# Patient Record
Sex: Male | Born: 1935 | Race: White | Hispanic: No | Smoking: Never smoker
Health system: Southern US, Community
[De-identification: ages and names within clinical notes are randomized; demographics above are authoritative.]

## PROBLEM LIST (undated history)

## (undated) DIAGNOSIS — I2510 Atherosclerotic heart disease of native coronary artery without angina pectoris: Secondary | ICD-10-CM

## (undated) DIAGNOSIS — I10 Essential (primary) hypertension: Secondary | ICD-10-CM

## (undated) DIAGNOSIS — K219 Gastro-esophageal reflux disease without esophagitis: Secondary | ICD-10-CM

## (undated) HISTORY — PX: TONSILLECTOMY: SUR1361

---

## 1997-12-13 ENCOUNTER — Ambulatory Visit (HOSPITAL_COMMUNITY): Admission: RE | Admit: 1997-12-13 | Discharge: 1997-12-13 | Payer: Self-pay | Admitting: *Deleted

## 1998-06-21 ENCOUNTER — Ambulatory Visit (HOSPITAL_COMMUNITY): Admission: RE | Admit: 1998-06-21 | Discharge: 1998-06-21 | Payer: Self-pay | Admitting: *Deleted

## 2003-11-21 ENCOUNTER — Inpatient Hospital Stay (HOSPITAL_COMMUNITY): Admission: EM | Admit: 2003-11-21 | Discharge: 2003-11-24 | Payer: Self-pay | Admitting: Emergency Medicine

## 2003-11-25 ENCOUNTER — Encounter: Admission: RE | Admit: 2003-11-25 | Discharge: 2003-11-25 | Payer: Self-pay | Admitting: Family Medicine

## 2004-04-23 ENCOUNTER — Emergency Department (HOSPITAL_COMMUNITY): Admission: EM | Admit: 2004-04-23 | Discharge: 2004-04-23 | Payer: Self-pay | Admitting: Emergency Medicine

## 2012-09-02 ENCOUNTER — Emergency Department (HOSPITAL_COMMUNITY)
Admission: EM | Admit: 2012-09-02 | Discharge: 2012-09-03 | Disposition: A | Discharge status: Home / Self Care | Payer: Medicare Other | Attending: Emergency Medicine | Admitting: Emergency Medicine

## 2012-09-02 ENCOUNTER — Emergency Department (HOSPITAL_COMMUNITY): Payer: Medicare Other

## 2012-09-02 ENCOUNTER — Encounter (HOSPITAL_COMMUNITY): Payer: Self-pay | Admitting: Emergency Medicine

## 2012-09-02 DIAGNOSIS — I2510 Atherosclerotic heart disease of native coronary artery without angina pectoris: Secondary | ICD-10-CM | POA: Insufficient documentation

## 2012-09-02 DIAGNOSIS — M4802 Spinal stenosis, cervical region: Secondary | ICD-10-CM | POA: Insufficient documentation

## 2012-09-02 DIAGNOSIS — R209 Unspecified disturbances of skin sensation: Secondary | ICD-10-CM | POA: Insufficient documentation

## 2012-09-02 DIAGNOSIS — IMO0002 Reserved for concepts with insufficient information to code with codable children: Secondary | ICD-10-CM

## 2012-09-02 DIAGNOSIS — Y929 Unspecified place or not applicable: Secondary | ICD-10-CM | POA: Insufficient documentation

## 2012-09-02 DIAGNOSIS — I10 Essential (primary) hypertension: Secondary | ICD-10-CM | POA: Insufficient documentation

## 2012-09-02 DIAGNOSIS — Y9389 Activity, other specified: Secondary | ICD-10-CM | POA: Insufficient documentation

## 2012-09-02 DIAGNOSIS — S0003XA Contusion of scalp, initial encounter: Secondary | ICD-10-CM | POA: Insufficient documentation

## 2012-09-02 DIAGNOSIS — S0083XA Contusion of other part of head, initial encounter: Secondary | ICD-10-CM | POA: Insufficient documentation

## 2012-09-02 DIAGNOSIS — W010XXA Fall on same level from slipping, tripping and stumbling without subsequent striking against object, initial encounter: Secondary | ICD-10-CM | POA: Insufficient documentation

## 2012-09-02 DIAGNOSIS — Z8719 Personal history of other diseases of the digestive system: Secondary | ICD-10-CM | POA: Insufficient documentation

## 2012-09-02 DIAGNOSIS — S01501A Unspecified open wound of lip, initial encounter: Secondary | ICD-10-CM | POA: Insufficient documentation

## 2012-09-02 HISTORY — DX: Essential (primary) hypertension: I10

## 2012-09-02 HISTORY — DX: Atherosclerotic heart disease of native coronary artery without angina pectoris: I25.10

## 2012-09-02 HISTORY — DX: Gastro-esophageal reflux disease without esophagitis: K21.9

## 2012-09-02 NOTE — ED Provider Notes (Addendum)
History     CSN: [id number]  Arrival date & time 09/02/12  1813   First MD Initiated Contact with Patient 09/02/12 1910      Chief Complaint  Patient presents with  . Fall  . Facial Laceration    (Consider location/radiation/quality/duration/timing/severity/associated sxs/prior treatment) HPI Comments: Patient comes to the ER for evaluation after a fall. Patient reports that he slipped on wet pavement and fell to the ground, landing face first. He is complaining of a laceration of the lip and pain and swelling around his nose. There was no loss of consciousness. Patient is not experiencing neck pain, but he does report numbness in tingling in both of his arms. He describes this as a pins and needles.  Patient is a 79 y.o. male presenting with fall.  Fall Pertinent negatives include no headaches.    Past Medical History  Diagnosis Date  . GERD (gastroesophageal reflux disease)   . Hypertension   . CAD (coronary artery disease)     History reviewed. No pertinent past surgical history.  No family history on file.  History  Substance Use Topics  . Smoking status: Never Smoker   . Smokeless tobacco: Not on file  . Alcohol Use: No      Review of Systems  HENT: Negative for neck pain.   Respiratory: Negative for shortness of breath.   Cardiovascular: Negative for chest pain.  Musculoskeletal: Positive for myalgias and arthralgias. Negative for back pain.  Skin: Positive for wound.  Neurological: Negative for headaches.  All other systems reviewed and are negative.    Allergies  Codeine  Home Medications  No current outpatient prescriptions on file.  BP 170/154  Pulse 60  Temp 98.7 F (37.1 C) (Oral)  Resp 19  SpO2 100%  Physical Exam  Constitutional: He is oriented to person, place, and time. He appears well-developed and well-nourished. No distress.  HENT:  Head: Normocephalic.  Right Ear: Hearing normal.  Nose: Nose normal.  Mouth/Throat: Oropharynx  is clear and moist and mucous membranes are normal.    Eyes: Conjunctivae normal and EOM are normal. Pupils are equal, round, and reactive to light.  Neck: Normal range of motion. Neck supple.  Cardiovascular: Normal rate, regular rhythm, S1 normal and S2 normal.  Exam reveals no gallop and no friction rub.   No murmur heard. Pulmonary/Chest: Effort normal and breath sounds normal. No respiratory distress. He exhibits no tenderness.  Abdominal: Soft. Normal appearance and bowel sounds are normal. There is no hepatosplenomegaly. There is no tenderness. There is no rebound, no guarding, no tenderness at McBurney's point and negative Murphy's sign. No hernia.  Musculoskeletal: Normal range of motion.  Neurological: He is alert and oriented to person, place, and time. He has normal strength. No cranial nerve deficit or sensory deficit. Coordination normal. GCS eye subscore is 4. GCS verbal subscore is 5. GCS motor subscore is 6.  Skin: Skin is warm, dry and intact. No rash noted. No cyanosis.  Psychiatric: He has a normal mood and affect. His speech is normal and behavior is normal. Thought content normal.    ED Course  Procedures (including critical care time)  Labs Reviewed - No data to display Dg Elbow Complete Left  09/02/2012  *RADIOLOGY REPORT*  Clinical Data: Fall  LEFT ELBOW - COMPLETE 3+ VIEW  Comparison: None.  Findings: There is spurring at the medial and lateral epicondyles of the humerus.  No acute fracture.  No dislocation.  No evidence of joint hemarthrosis.  IMPRESSION: No acute bony pathology.  Chronic changes.   Original Report Authenticated By: Jolaine Click, M.D.    Dg Elbow Complete Right  09/02/2012  *RADIOLOGY REPORT*  Clinical Data: Fall  RIGHT ELBOW - COMPLETE 3+ VIEW  Comparison: None.  Findings: No acute fracture and no dislocation.  IMPRESSION: No acute bony pathology.   Original Report Authenticated By: Jolaine Click, M.D.    Dg Wrist Complete Left  09/02/2012  *RADIOLOGY  REPORT*  Clinical Data: Fall  LEFT WRIST - COMPLETE 3+ VIEW  Comparison: None.  Findings: No acute fracture.  No dislocation.  Chondrocalcinosis of the triangular fibrocartilage is noted.  Degenerative changes at the first metacarpal phalangeal joint are present.  IMPRESSION: Chronic changes.  No acute bony pathology.   Original Report Authenticated By: Jolaine Click, M.D.    Dg Wrist Complete Right  09/02/2012  *RADIOLOGY REPORT*  Clinical Data: Injury  RIGHT WRIST - COMPLETE 3+ VIEW  Comparison: None.  Findings: No acute fracture.  No dislocation.  Chronic appearing erosion of the distal ulna.  Chondrocalcinosis of the triangular fibrocartilage.  Degenerative changes in the thumb.  IMPRESSION: No acute bony pathology.  Chronic changes.  Consider gout versus CPPD.   Original Report Authenticated By: Jolaine Click, M.D.    Ct Head Wo Contrast  09/02/2012  *RADIOLOGY REPORT*  Clinical Data: Fall.  CT HEAD WITHOUT CONTRAST  Technique:  Contiguous axial images were obtained from the base of the skull through the vertex without contrast.  Comparison: 11/21/2003.  Findings: No skull fracture or intracranial hemorrhage.  Global atrophy without hydrocephalus.  Small vessel disease type changes.  Remote right caudate head infarct.  No CT evidence of large acute infarct.  No intracranial mass lesion detected on this unenhanced exam.  Vascular calcifications.  Partial empty sella.  Globes appear intact.  IMPRESSION: No skull fracture or intracranial hemorrhage.  Please see above.   Original Report Authenticated By: Lacy Duverney, M.D.    Ct Cervical Spine Wo Contrast  09/02/2012  *RADIOLOGY REPORT*  Clinical Data: Fall.  Tingling and numbness both arms.  CT CERVICAL SPINE WITHOUT CONTRAST  Technique:  Multidetector CT imaging of the cervical spine was performed. Multiplanar CT image reconstructions were also generated.  Comparison: MR same date.  Findings: No evidence of cervical spine fracture.  The questioned minimal  amount of fluid anterior to the mid cervical spine may represent fat as no prevertebral fluid is noted on present exam.  Degenerative changes C1-2 articulation with small cystic changes of the dens.  Cervical spondylotic changes contribute to marked bilateral foraminal narrowing C3-4 through C6-7.  Disc osteophyte complex C3-4 through C6-7 with spinal stenosis and cord flattening most prominent C4-5 and C5-6 level (slightly less notable at the C3-4 level followed by the C6-7 level).  Visualized lung apices clear.  IMPRESSION: No evidence of cervical spine fracture.  The questioned minimal amount of fluid anterior to the mid cervical spine may represent fat as no prevertebral fluid is noted on present exam.  Degenerative changes C1-2 articulation with small cystic changes of the dens.  Cervical spondylotic changes contribute to marked bilateral foraminal narrowing C3-4 through C6-7.  Disc osteophyte complex C3-4 through C6-7 with spinal stenosis and cord flattening most prominent C4-5 and C5-6 level (slightly less notable at the C3-4 level followed by the C6-7 level).   Original Report Authenticated By: Lacy Duverney, M.D.    Mr Cervical Spine Wo Contrast  09/02/2012  *RADIOLOGY REPORT*  Clinical Data: Fall.  Tingling and numbness  in both arms.  MRI CERVICAL SPINE WITHOUT CONTRAST  Technique:  Multiplanar and multiecho pulse sequences of the cervical spine, to include the craniocervical junction and cervicothoracic junction, were obtained according to standard protocol without intravenous contrast.  Comparison: No comparison cervical spine exam.  Findings: Cervical medullary junction and visualized intracranial structures unremarkable.  Minimal prevertebral fluid C3, C4 and C5 level without other findings to suggest ligamentous injury or osseous injury.  If the osseous injury is of high clinical concern then correlation with CT recommended as MR may not detect subtle fractures.  Degenerative changes C1-2  articulation with subchondral cyst of the dens.  No focal cervical cord signal abnormality.  Both vertebral arteries are patent.  Visualized paravertebral structures unremarkable.  C2-3:  Left-sided facet joint degenerative changes.  Very mild bulge.  C3-4:  Facet joint degenerative changes greater on the left.  Broad- based disc osteophyte complex with minimal retrolisthesis of C3. Spinal stenosis with mild cord flattening greater on the right. Marked bilateral foraminal narrowing.  C4-5:  Broad-based disc osteophyte complex slightly greater to the right.  Spinal stenosis with cord compression greater on the right. Marked bilateral foraminal narrowing greater on the right.  C5-6: Broad-based disc osteophyte complex.  Spinal stenosis with cord compression.  Marked bilateral foraminal narrowing.  C6-7:  Broad-based disc osteophyte complex.  Spinal stenosis with mild cord flattening.  Marked bilateral foraminal narrowing.  C7-T1:  Facet joint degenerative changes.  Minimal anterior slip C7.  Right-sided prominent nerve root sleeve.  Very mild bulge. Mild left foraminal narrowing.  IMPRESSION: Minimal prevertebral fluid C3, C4 and C5 level without other findings to suggest ligamentous injury or osseous injury.  If the osseous injury is of high clinical concern then correlation with CT recommended as MR may not detect subtle fractures.  Cervical spondylotic changes most notable C3-4 through the C6-7 level where bilateral prominent foraminal narrowing is noted. Spinal stenosis and cord compression is most significant at the C4- 5 and C5-6 level.  Please see above for further detail.  Results discussed with Dr. Blinda Leatherwood 09/02/2012 10:35 p.m.   Original Report Authenticated By: Lacy Duverney, M.D.      Diagnosis: 1. Cervical radiculopathy 2. Spinal stenosis 3. Facial contusion 4. Lip laceration    MDM  Patient presents to the ER after a fall. Patient slipped on wet pavement and fell face forward. He came to the ER  with complaints of facial contusion laceration, but also complains of bilateral paresthesias, pins and needles in the arms. There was concern for possible central cord syndrome. He did not have any perceived and objective weakness, but does have the subjective paresthesias. CT head, CT cervical spine, MRI cervical spine were performed. Patient has findings of fairly significant spinal stenosis no obvious cord lesion.  Case was discussed with Dr. Phoebe Perch, on call for neurosurgery. He has reviewed the films and does not feel there is any need for surgical intervention. He reports that the patient can be treated with a Medrol Dosepak and vitamin E, soft collar and followup in the office in 2-3 weeks.  Patient was counseled to return to the ER immediately if he has any increased paresthesias or weakness in the upper extremities.      Gilda Crease, MD 09/03/12 0004  Patient's laceration to lower lip is very superficial. He did not require any repair.  Gilda Crease, MD 09/03/12 [phone]

## 2012-09-02 NOTE — ED Notes (Signed)
Patient transported to CT 

## 2012-09-02 NOTE — ED Notes (Signed)
Pt states he [place] mall and both arms are in pain 10/10,   And facial abrasions on lips and bruising under right eye.  Denies LOC,  Pt is confused and ask the same questions over and over,  Daughter states this is his norm and that he resides along due to the passing of his wife this past August

## 2012-09-02 NOTE — ED Notes (Signed)
States that he slipped on wet pavement. States that he has pin and needle sensation in arms.

## 2012-09-02 NOTE — ED Notes (Signed)
Reassessed pt after returning from MRI,  Cleaned pt face,   Pt has complaints of bilateral arm pain that is unchanged

## 2012-09-03 MED ORDER — HYDROCODONE-ACETAMINOPHEN 5-325 MG PO TABS: 1.0000 | ORAL_TABLET | ORAL | Status: DC | PRN

## 2012-09-03 MED ORDER — HYDROCORTISONE 1 % EX CREA: TOPICAL_CREAM | CUTANEOUS | Status: DC

## 2012-09-03 MED ORDER — MORPHINE SULFATE 4 MG/ML IJ SOLN
4.0000 mg | Freq: Once | INTRAMUSCULAR | Status: AC
  Administered 2012-09-03: 4 mg via INTRAMUSCULAR
  Filled 2012-09-03: qty 1

## 2012-09-03 MED ORDER — VITAMIN E 200 UNITS PO CAPS: 400.0000 [IU] | ORAL_CAPSULE | Freq: Two times a day (BID) | ORAL | Status: DC

## 2012-09-03 MED ORDER — METHYLPREDNISOLONE 4 MG PO KIT: PACK | ORAL | Status: DC

## 2012-09-03 MED ORDER — DEXAMETHASONE SODIUM PHOSPHATE 10 MG/ML IJ SOLN
10.0000 mg | Freq: Once | INTRAMUSCULAR | Status: AC
  Administered 2012-09-03: 10 mg via INTRAMUSCULAR
  Filled 2012-09-03: qty 1

## 2012-09-03 MED ORDER — ONDANSETRON HCL 4 MG/2ML IJ SOLN
4.0000 mg | Freq: Once | INTRAMUSCULAR | Status: AC
  Administered 2012-09-03: 4 mg via INTRAMUSCULAR
  Filled 2012-09-03: qty 2

## 2012-12-28 ENCOUNTER — Emergency Department (HOSPITAL_COMMUNITY)
Admission: EM | Admit: 2012-12-28 | Discharge: 2012-12-28 | Disposition: A | Discharge status: Home / Self Care | Payer: Medicare Other | Attending: Emergency Medicine | Admitting: Emergency Medicine

## 2012-12-28 ENCOUNTER — Encounter (HOSPITAL_COMMUNITY): Payer: Self-pay | Admitting: *Deleted

## 2012-12-28 DIAGNOSIS — R0981 Nasal congestion: Secondary | ICD-10-CM

## 2012-12-28 DIAGNOSIS — I10 Essential (primary) hypertension: Secondary | ICD-10-CM | POA: Insufficient documentation

## 2012-12-28 DIAGNOSIS — J3489 Other specified disorders of nose and nasal sinuses: Secondary | ICD-10-CM | POA: Insufficient documentation

## 2012-12-28 DIAGNOSIS — Z8719 Personal history of other diseases of the digestive system: Secondary | ICD-10-CM | POA: Insufficient documentation

## 2012-12-28 DIAGNOSIS — I2510 Atherosclerotic heart disease of native coronary artery without angina pectoris: Secondary | ICD-10-CM | POA: Insufficient documentation

## 2012-12-28 MED ORDER — SALINE SPRAY 0.65 % NA SOLN
1.0000 | NASAL | Status: DC | PRN
  Administered 2012-12-28: 1 via NASAL
  Filled 2012-12-28: qty 44

## 2012-12-28 NOTE — ED Notes (Signed)
The pt has had nasal congestion tonight after he used some nose drops that he received through the mail.  He has not been  Able to sleep because of the nasal congestion.  No other problems  Except for the nasal congestion

## 2012-12-28 NOTE — ED Provider Notes (Signed)
History     CSN: [id number]  Arrival date & time 12/28/12  0251   First MD Initiated Contact with Patient 12/28/12 0515      Chief Complaint  Patient presents with  . nasal congestion     (Consider location/radiation/quality/duration/timing/severity/associated sxs/prior treatment) HPI Comments: [name] is an 79 year old gentleman, with seasonal, allergies.  He's been taking loratadine for his symptoms.  Proximal to the [place].  He, states he was using some nasal spray from [employer] and not having any problems with nasal congestion, but he received in the mail to different nasal sprays from the [place].  He used one aunt since that time.  Last night.  He has had increased congestion, and difficulty breathing through his nose.  He denies any fever.  Nose bleed, headache, dizziness.  But is concerned because of the increased congestion Patient does not know the name of these medications and did not bring them with him  The history is provided by the patient.    Past Medical History  Diagnosis Date  . GERD (gastroesophageal reflux disease)   . Hypertension   . CAD (coronary artery disease)     History reviewed. No pertinent past surgical history.  No family history on file.  History  Substance Use Topics  . Smoking status: Never Smoker   . Smokeless tobacco: Not on file  . Alcohol Use: No      Review of Systems  Unable to perform ROS Constitutional: Negative for fever and chills.  HENT: Positive for congestion. Negative for sore throat, rhinorrhea, trouble swallowing and postnasal drip.   Respiratory: Negative for cough, shortness of breath and wheezing.   Gastrointestinal: Negative for nausea and vomiting.  Neurological: Negative for dizziness, weakness and headaches.  All other systems reviewed and are negative.    Allergies  Codeine  Home Medications  No current outpatient prescriptions on file.  BP 127/61  Pulse 67  Temp(Src) 97.9 F (36.6 C) (Oral)  Resp 18   SpO2 94%  Physical Exam  Nursing note and vitals reviewed. Constitutional: He is oriented to person, place, and time. He appears well-developed and well-nourished.  HENT:  Head: Normocephalic.  Nose: Sinus tenderness present. No mucosal edema or rhinorrhea. Right sinus exhibits no maxillary sinus tenderness and no frontal sinus tenderness. Left sinus exhibits no maxillary sinus tenderness and no frontal sinus tenderness.  Mouth/Throat: Oropharynx is clear and moist.  Mucosa appears dry in the anterior nares  Eyes: Conjunctivae are normal. Pupils are equal, round, and reactive to light.  Neck: Normal range of motion.  Cardiovascular: Normal rate and regular rhythm.   Pulmonary/Chest: Effort normal and breath sounds normal.  Abdominal: Soft.  Musculoskeletal: Normal range of motion.  Neurological: He is alert and oriented to person, place, and time.  Skin: Skin is warm and dry.    ED Course  Procedures (including critical care time)  Labs Reviewed - No data to display No results found.   1. Nasal congestion       MDM   I recommended that the patient.  Not use the prescription, nasal spray from the [place] until he speaks with his physician.  Unfortunately, the patient did not know the name of the sprays at the time of evaluation I provided the patient with a bottle of Ocean mist nasal spray that he can use to moisten his nasal mucosa        Arman Filter, NP 12/28/12 [phone]

## 2012-12-28 NOTE — ED Provider Notes (Signed)
Medical screening examination/treatment/procedure(s) were conducted as a shared visit with non-physician practitioner(s) and myself.  I personally evaluated the patient during the encounter. [facility] nasal congestion, has 2 prescriptions at home from PCP unable to say what they are. No fevers, or facial swelling, no indication for ABX. Plan saline nasal spray and close outpatient follow up  Sunnie Nielsen, MD 12/28/12 [phone]

## 2013-03-15 ENCOUNTER — Emergency Department (HOSPITAL_COMMUNITY): Payer: Medicare Other

## 2013-03-15 ENCOUNTER — Encounter (HOSPITAL_COMMUNITY): Payer: Self-pay | Admitting: *Deleted

## 2013-03-15 ENCOUNTER — Emergency Department (HOSPITAL_COMMUNITY)
Admission: EM | Admit: 2013-03-15 | Discharge: 2013-03-15 | Disposition: A | Discharge status: Home / Self Care | Payer: Medicare Other | Attending: Emergency Medicine | Admitting: Emergency Medicine

## 2013-03-15 DIAGNOSIS — Z8719 Personal history of other diseases of the digestive system: Secondary | ICD-10-CM | POA: Insufficient documentation

## 2013-03-15 DIAGNOSIS — I2510 Atherosclerotic heart disease of native coronary artery without angina pectoris: Secondary | ICD-10-CM | POA: Insufficient documentation

## 2013-03-15 DIAGNOSIS — R072 Precordial pain: Secondary | ICD-10-CM | POA: Insufficient documentation

## 2013-03-15 DIAGNOSIS — R079 Chest pain, unspecified: Secondary | ICD-10-CM

## 2013-03-15 DIAGNOSIS — I10 Essential (primary) hypertension: Secondary | ICD-10-CM | POA: Insufficient documentation

## 2013-03-15 LAB — URINALYSIS, ROUTINE W REFLEX MICROSCOPIC
Bilirubin Urine: NEGATIVE
Leukocytes, UA: NEGATIVE
Nitrite: NEGATIVE
Specific Gravity, Urine: 1.011 (ref 1.005–1.030)
Urobilinogen, UA: 1 mg/dL (ref 0.0–1.0)
pH: 7 (ref 5.0–8.0)

## 2013-03-15 LAB — COMPREHENSIVE METABOLIC PANEL
Albumin: 3.6 g/dL (ref 3.5–5.2)
Alkaline Phosphatase: 49 U/L (ref 39–117)
BUN: 12 mg/dL (ref 6–23)
Calcium: 9.2 mg/dL (ref 8.4–10.5)
GFR calc Af Amer: 72 mL/min — ABNORMAL LOW (ref >90)
Glucose, Bld: 112 mg/dL — ABNORMAL HIGH (ref 70–99)
Potassium: 3.9 mEq/L (ref 3.5–5.1)
Total Protein: 6 g/dL (ref 6.0–8.3)

## 2013-03-15 LAB — CBC WITH DIFFERENTIAL/PLATELET
Basophils Relative: 0 % (ref 0–1)
Eosinophils Absolute: 0.1 10*3/uL (ref 0.0–0.7)
Eosinophils Relative: 2 % (ref 0–5)
Hemoglobin: 11.6 g/dL — ABNORMAL LOW (ref 13.0–17.0)
MCH: 35.6 pg — ABNORMAL HIGH (ref 26.0–34.0)
MCHC: 37.4 g/dL — ABNORMAL HIGH (ref 30.0–36.0)
MCV: 95.1 fL (ref 78.0–100.0)
Monocytes Relative: 10 % (ref 3–12)
Neutrophils Relative %: 68 % (ref 43–77)
Platelets: 225 10*3/uL (ref 150–400)

## 2013-03-15 LAB — POCT I-STAT TROPONIN I: Troponin i, poc: 0.01 ng/mL (ref 0.00–0.08)

## 2013-03-15 LAB — URINE MICROSCOPIC-ADD ON

## 2013-03-15 LAB — LIPASE, BLOOD: Lipase: 87 U/L — ABNORMAL HIGH (ref 11–59)

## 2013-03-15 MED ORDER — ASPIRIN 81 MG PO CHEW: 324.0000 mg | CHEWABLE_TABLET | Freq: Once | ORAL | Status: DC

## 2013-03-15 MED ORDER — NITROGLYCERIN 0.4 MG SL SUBL
0.4000 mg | SUBLINGUAL_TABLET | SUBLINGUAL | Status: AC | PRN
  Administered 2013-03-15 (×3): 0.4 mg via SUBLINGUAL
  Filled 2013-03-15: qty 25

## 2013-03-15 NOTE — ED Notes (Signed)
Patient given sandwich and ginger ale 

## 2013-03-15 NOTE — ED Notes (Signed)
Pt reported Lt sided CP that radiated into LT arm. Initial CP was 8/10. Ems gave 3 baby ASA and a totoal of 3 NGT SL tabs . The CP was then 4/10. Pt denies a HX of MI .

## 2013-03-15 NOTE — ED Provider Notes (Addendum)
Pt signed out by Dr Elesa Massed, that she had discussed admit/transfer to [place], and that pt was refusing admit, ?repeat trop pending.   Recheck pt, denies any cp or discomfort.  Pt indicates he was not concerned with any cp, but states he has long hx gerd for the past 30+ years, states he felt like that was acting up earlier.  Pt also indicates was having bil ankle pain for 'long time' and was told possibly arthritis by his doctor and orthopedist.  Pt denies leg swelling.   Pt on several rechecks remains asymptomatic. No cp or sob.  I discussed w pt that Dr Elesa Massed had mentioned possible admit/transfer to [place], and I also offered admission here.  Pt states he does not want to be transferred to [facility] or admitted here.  Pt requests d/c to home, states he wants no further evaluation or testing, feels fine, and is ready for d/c.   2nd troponin approx 7 hours after arrival to ed also normal.   Pt remains asymptomatic, and persists in request for d/c, not wanting admission or [facility] transfer.  Per pts requests, will d/c to home, encourage close follow up with pcp and cardiology.        Suzi Roots, MD 03/15/13 2001

## 2013-03-15 NOTE — ED Notes (Signed)
Call PT Daughter at [phone] [facility].

## 2013-03-15 NOTE — ED Notes (Signed)
PT refuses Chest X-ray because [facility].

## 2013-03-15 NOTE — ED Notes (Signed)
Call [facility] to see how to get a pt.admitted they stated that they will fax a package and we will need to fill it out and also send all records and labs back to them with the admit package.

## 2013-03-15 NOTE — ED Provider Notes (Signed)
TIME SEEN: 12:52 PM  CHIEF COMPLAINT: Chest pain  HPI: Patient is an 79 year old male with a history of hypertension, acid reflux, ? CAD who presents the emergency department with substernal chest pain that started while at church this morning. He is unable to describe the pain as he is a very poor historian. He denies to me that he has had any radiation of this pain. Denies any shortness of breath, nausea, diaphoresis. He is unable to tell me if he has had similar chest pain in the past. Denies a prior history of MI or cardiac stents. No history of PE or DVT. No recent fever, cough. No vomiting or diarrhea. No bloody stools or melena.  Per EMS, patient's chest pain did improve with nitroglycerin tablets.  Patient has received aspirin by EMS.  ROS: See HPI Constitutional: no fever  Eyes: no drainage  ENT: no runny nose   Cardiovascular:  chest pain  Resp: no SOB  GI: no vomiting GU: no dysuria Integumentary: no rash  Allergy: no hives  Musculoskeletal: no leg swelling  Neurological: no slurred speech ROS otherwise negative  PAST MEDICAL HISTORY/PAST SURGICAL HISTORY:  Past Medical History  Diagnosis Date  . GERD (gastroesophageal reflux disease)   . Hypertension   . CAD (coronary artery disease)     MEDICATIONS:  Prior to Admission medications   Not on File    ALLERGIES:  Allergies  Allergen Reactions  . Codeine Other (See Comments)    confusion    SOCIAL HISTORY:  History  Substance Use Topics  . Smoking status: Never Smoker   . Smokeless tobacco: Not on file  . Alcohol Use: No    FAMILY HISTORY: No family history on file.  EXAM: There were no vitals taken for this visit. CONSTITUTIONAL: Alert and oriented x3, a poor historian and responds appropriately to questions. Well-appearing; well-nourished HEAD: Normocephalic EYES: Conjunctivae clear, PERRL ENT: normal nose; no rhinorrhea; moist mucous membranes; pharynx without lesions noted NECK: Supple, no  meningismus, no LAD  CARD: RRR; S1 and S2 appreciated; no murmurs, no clicks, no rubs, no gallops RESP: Normal chest excursion without splinting or tachypnea; breath sounds clear and equal bilaterally; no wheezes, no rhonchi, no rales,  ABD/GI: Normal bowel sounds; non-distended; soft, non-tender, no rebound, no guarding BACK:  The back appears normal and is non-tender to palpation, there is no CVA tenderness EXT: Normal ROM in all joints; non-tender to palpation; no edema; normal capillary refill; no cyanosis    SKIN: Normal color for age and race; warm NEURO: Moves all extremities equally PSYCH: The patient's mood and manner are appropriate. Grooming and personal hygiene are appropriate.  MEDICAL DECISION MAKING: Patient with some risk factors for ACS with chest pain. Patient is unable to give much details for his chest pain.  Suspect early cognitive delay. His exam is unremarkable and he is hemodynamically stable. We'll obtain cardiac labs, chest x-ray and attempt to get chest pain-free.   Date: 03/15/2013 12:55PM  Rate: 60  Rhythm: normal sinus rhythm  QRS Axis: normal  Intervals: normal  ST/T Wave abnormalities: normal  Conduction Disutrbances: none  Narrative Interpretation: unremarkable; no ischemic changes     ED PROGRESS: The patient's EKG shows no ischemic changes. His cardiac enzymes x 1 are negative. He still has intermittent episodes of what he describes as severe chest pain that improve with nitroglycerin. Concern for stuttering chest pain that may be unstable angina. He is still hemodynamically stable. He is refusing any chest x-ray at this  time. I discussed with his daughter, Chip Boer, who is also his power of attorney who reports the patient has been complaining of bilateral lower extremity pain recently and has a possible new diagnosis of prostate cancer. Patient does have multiple risk factors for pulmonary embolus but is not hypoxic or tachycardic. He is refusing to let me do  any with further workup at this time and stated he would like to be transferred to [place] for further evaluation and treatment. Patient's daughter was unable to provide any other history she was not with the patient today and she is not very familiar with his medical issues.  She agrees with transfer to [facility]. Patient also refusing any other medications at this time.  Daughter Chip Boer cell [phone]                          Work [phone]  Remigio Eisenmenger cell [phone]  5:07 PM  Signed out.  Waiting to hear back from [place] for transfer for admission for chest pain.  Layla Maw Casson Catena, DO 03/15/13 [phone]

## 2014-05-31 ENCOUNTER — Emergency Department (HOSPITAL_BASED_OUTPATIENT_CLINIC_OR_DEPARTMENT_OTHER)
Admission: EM | Admit: 2014-05-31 | Discharge: 2014-05-31 | Disposition: A | Discharge status: Home / Self Care | Payer: Medicare Other | Attending: Emergency Medicine | Admitting: Emergency Medicine

## 2014-05-31 ENCOUNTER — Encounter (HOSPITAL_BASED_OUTPATIENT_CLINIC_OR_DEPARTMENT_OTHER): Payer: Self-pay | Admitting: Emergency Medicine

## 2014-05-31 DIAGNOSIS — G8929 Other chronic pain: Secondary | ICD-10-CM | POA: Diagnosis not present

## 2014-05-31 DIAGNOSIS — R319 Hematuria, unspecified: Secondary | ICD-10-CM | POA: Diagnosis present

## 2014-05-31 DIAGNOSIS — Z79899 Other long term (current) drug therapy: Secondary | ICD-10-CM | POA: Insufficient documentation

## 2014-05-31 DIAGNOSIS — I10 Essential (primary) hypertension: Secondary | ICD-10-CM | POA: Insufficient documentation

## 2014-05-31 DIAGNOSIS — Z8719 Personal history of other diseases of the digestive system: Secondary | ICD-10-CM | POA: Diagnosis not present

## 2014-05-31 DIAGNOSIS — N390 Urinary tract infection, site not specified: Secondary | ICD-10-CM

## 2014-05-31 DIAGNOSIS — I2510 Atherosclerotic heart disease of native coronary artery without angina pectoris: Secondary | ICD-10-CM | POA: Diagnosis not present

## 2014-05-31 DIAGNOSIS — Z7982 Long term (current) use of aspirin: Secondary | ICD-10-CM | POA: Insufficient documentation

## 2014-05-31 LAB — URINE MICROSCOPIC-ADD ON

## 2014-05-31 LAB — CBC WITH DIFFERENTIAL/PLATELET
BASOS PCT: 0 % (ref 0–1)
Basophils Absolute: 0 10*3/uL (ref 0.0–0.1)
Eosinophils Absolute: 0.1 10*3/uL (ref 0.0–0.7)
Eosinophils Relative: 2 % (ref 0–5)
HEMATOCRIT: 35.7 % — AB (ref 39.0–52.0)
HEMOGLOBIN: 13 g/dL (ref 13.0–17.0)
LYMPHS ABS: 1.8 10*3/uL (ref 0.7–4.0)
LYMPHS PCT: 25 % (ref 12–46)
MCH: 36.1 pg — ABNORMAL HIGH (ref 26.0–34.0)
MCHC: 36.4 g/dL — AB (ref 30.0–36.0)
MCV: 99.2 fL (ref 78.0–100.0)
MONO ABS: 0.8 10*3/uL (ref 0.1–1.0)
MONOS PCT: 11 % (ref 3–12)
NEUTROS ABS: 4.4 10*3/uL (ref 1.7–7.7)
NEUTROS PCT: 62 % (ref 43–77)
Platelets: 306 10*3/uL (ref 150–400)
RBC: 3.6 MIL/uL — AB (ref 4.22–5.81)
RDW: 11.1 % — ABNORMAL LOW (ref 11.5–15.5)
WBC: 7.1 10*3/uL (ref 4.0–10.5)

## 2014-05-31 LAB — URINALYSIS, ROUTINE W REFLEX MICROSCOPIC
BILIRUBIN URINE: NEGATIVE
GLUCOSE, UA: NEGATIVE mg/dL
HGB URINE DIPSTICK: NEGATIVE
Ketones, ur: 15 mg/dL — AB
Nitrite: POSITIVE — AB
PH: 5.5 (ref 5.0–8.0)
Protein, ur: NEGATIVE mg/dL
SPECIFIC GRAVITY, URINE: 1.008 (ref 1.005–1.030)
UROBILINOGEN UA: 2 mg/dL — AB (ref 0.0–1.0)

## 2014-05-31 LAB — BASIC METABOLIC PANEL
Anion gap: 13 (ref 5–15)
BUN: 12 mg/dL (ref 6–23)
CHLORIDE: 94 meq/L — AB (ref 96–112)
CO2: 24 meq/L (ref 19–32)
CREATININE: 1.1 mg/dL (ref 0.50–1.35)
Calcium: 9.5 mg/dL (ref 8.4–10.5)
GFR calc Af Amer: 67 mL/min — ABNORMAL LOW (ref >90)
GFR calc non Af Amer: 58 mL/min — ABNORMAL LOW (ref >90)
GLUCOSE: 123 mg/dL — AB (ref 70–99)
POTASSIUM: 3.8 meq/L (ref 3.7–5.3)
Sodium: 131 mEq/L — ABNORMAL LOW (ref 137–147)

## 2014-05-31 MED ORDER — LIDOCAINE HCL (PF) 1 % IJ SOLN
INTRAMUSCULAR | Status: AC
  Administered 2014-05-31: 2 mL
  Filled 2014-05-31: qty 5

## 2014-05-31 MED ORDER — CEPHALEXIN 500 MG PO CAPS: 500.0000 mg | ORAL_CAPSULE | Freq: Four times a day (QID) | ORAL | Status: DC

## 2014-05-31 MED ORDER — CEFTRIAXONE SODIUM 1 G IJ SOLR
1.0000 g | Freq: Once | INTRAMUSCULAR | Status: AC
  Administered 2014-05-31: 1 g via INTRAMUSCULAR
  Filled 2014-05-31: qty 10

## 2014-05-31 NOTE — Discharge Instructions (Signed)

## 2014-05-31 NOTE — ED Notes (Signed)
Pt's son [facility] bedside [facility]

## 2014-05-31 NOTE — ED Provider Notes (Signed)
CSN: [id number]     Arrival date & time 05/31/14  1524 History  This chart was scribed for Gilda Creasehristopher J. Heavenly Christine, by Gwenyth Oberatherine Macek, ED Scribe. This patient was seen in room MH10/MH10 and the patient's care was started at 3:35 PM.     Chief Complaint  Patient presents with  . Hematuria   The history is provided by the patient. No language interpreter was used.   HPI Comments: Johnny Zimmerman is a 80 y.o. male with a history of GERD, CAD, HTN, who presents to the Emergency Department complaining of hematuria that started yesterday. Pt states groin pain and increased frequency as associated symptoms. He states history of chronic back pain and hemorrhoids. Pt notes that current pain is not worse than baseline. He denies vomiting and dysuria.   Past Medical History  Diagnosis Date  . GERD (gastroesophageal reflux disease)   . Hypertension   . CAD (coronary artery disease)    History reviewed. No pertinent past surgical history. No family history on file. History  Substance Use Topics  . Smoking status: Never Smoker   . Smokeless tobacco: Not on file  . Alcohol Use: No    Review of Systems  Gastrointestinal: Positive for vomiting.  Genitourinary: Positive for frequency and hematuria. Negative for dysuria.  Musculoskeletal: Positive for back pain.  All other systems reviewed and are negative.     Allergies  Codeine  Home Medications   Prior to Admission medications   Medication Sig Start Date End Date Taking? Authorizing Provider  aspirin EC 81 MG tablet Take 81 mg by mouth daily.    Historical Provider, MD  cetirizine (ZYRTEC) 10 MG tablet Take 10 mg by mouth daily.    Historical Provider, MD  cholecalciferol (VITAMIN D) 1000 UNITS tablet Take 1,000 Units by mouth daily. Chew after a meal    Historical Provider, MD  docusate sodium (COLACE) 100 MG capsule Take 100 mg by mouth daily. Hold for loose stools    Historical Provider, MD  doxazosin (CARDURA) 8 MG tablet Take 4 mg  by mouth at bedtime.    Historical Provider, MD  metoprolol tartrate (LOPRESSOR) 25 MG tablet Take 6.25 mg by mouth 2 (two) times daily. 1/4 tablet    Historical Provider, MD   BP 158/84 mmHg  Pulse 76  Temp(Src) 98.3 F (36.8 C) (Oral)  Resp 16  Ht 5\' 8"  (1.727 m)  Wt 152 lb (68.947 kg)  BMI 23.12 kg/m2  SpO2 98% Physical Exam  Constitutional: He is oriented to person, place, and time. He appears well-developed and well-nourished. No distress.  HENT:  Head: Normocephalic and atraumatic.  Right Ear: Hearing normal.  Left Ear: Hearing normal.  Nose: Nose normal.  Mouth/Throat: Oropharynx is clear and moist and mucous membranes are normal.  Eyes: Conjunctivae and EOM are normal. Pupils are equal, round, and reactive to light.  Neck: Normal range of motion. Neck supple.  Cardiovascular: Regular rhythm, S1 normal and S2 normal.  Exam reveals no gallop and no friction rub.   No murmur heard. Pulmonary/Chest: Effort normal and breath sounds normal. No respiratory distress. He exhibits no tenderness.  Abdominal: Soft. Normal appearance and bowel sounds are normal. There is no hepatosplenomegaly. There is no tenderness. There is no rebound, no guarding, no tenderness at McBurney's point and negative Murphy's sign. No hernia.  Musculoskeletal: Normal range of motion.  Neurological: He is alert and oriented to person, place, and time. He has normal strength. No cranial nerve deficit or sensory  deficit. Coordination normal. GCS eye subscore is 4. GCS verbal subscore is 5. GCS motor subscore is 6.  Skin: Skin is warm, dry and intact. No rash noted. No cyanosis.  Psychiatric: He has a normal mood and affect. His speech is normal and behavior is normal. Thought content normal.    ED Course  Procedures (including critical care time) DIAGNOSTIC STUDIES: Oxygen Saturation is 98% on RA, normal by my interpretation.    COORDINATION OF CARE: 3:37 PM Discussed treatment plan with pt at bedside and  pt agreed to plan.   Labs Review Labs Reviewed - No data to display  Imaging Review No results found.   EKG Interpretation None      MDM   Final diagnoses:  None  UTI  Patient presents to the ER for evaluation of blood in his urine. Patient has noticed urinary frequency and some dysuria associated with the symptoms for the last 2 days. Patient has normal vital signs. No fever. No systemic symptoms. Patient's abdominal exam was benign and nontender. He did complain of some slight pressure and discomfort in the area of the bladder, however. Patient's urinalysis suggests infection more than hematuria. He does have some orange discoloration to the urine, positive for urobilinogen.no right upper quadrant tenderness. No evidence of jaundice. Patient's renal function is normal. Will treat empirically for infection, culture pending.  I personally performed the services described in this documentation, which was scribed in my presence. The recorded information has been reviewed and is accurate.    Gilda Creasehristopher J. Llesenia Fogal, MD 05/31/14 [phone]

## 2014-05-31 NOTE — ED Notes (Signed)
MD at bedside. 

## 2014-05-31 NOTE — ED Notes (Signed)
Blood in urine x2 days.

## 2014-06-01 LAB — URINE CULTURE
COLONY COUNT: NO GROWTH
CULTURE: NO GROWTH

## 2014-06-19 ENCOUNTER — Emergency Department (HOSPITAL_BASED_OUTPATIENT_CLINIC_OR_DEPARTMENT_OTHER): Payer: Medicare Other

## 2014-06-19 ENCOUNTER — Emergency Department (HOSPITAL_BASED_OUTPATIENT_CLINIC_OR_DEPARTMENT_OTHER)
Admission: EM | Admit: 2014-06-19 | Discharge: 2014-06-19 | Disposition: A | Discharge status: Home / Self Care | Payer: Medicare Other | Attending: Emergency Medicine | Admitting: Emergency Medicine

## 2014-06-19 ENCOUNTER — Encounter (HOSPITAL_BASED_OUTPATIENT_CLINIC_OR_DEPARTMENT_OTHER): Payer: Self-pay | Admitting: *Deleted

## 2014-06-19 DIAGNOSIS — Y92121 Bathroom in nursing home as the place of occurrence of the external cause: Secondary | ICD-10-CM | POA: Insufficient documentation

## 2014-06-19 DIAGNOSIS — Z792 Long term (current) use of antibiotics: Secondary | ICD-10-CM | POA: Insufficient documentation

## 2014-06-19 DIAGNOSIS — Y9389 Activity, other specified: Secondary | ICD-10-CM | POA: Diagnosis not present

## 2014-06-19 DIAGNOSIS — Z7982 Long term (current) use of aspirin: Secondary | ICD-10-CM | POA: Insufficient documentation

## 2014-06-19 DIAGNOSIS — S40812A Abrasion of left upper arm, initial encounter: Secondary | ICD-10-CM | POA: Diagnosis not present

## 2014-06-19 DIAGNOSIS — K219 Gastro-esophageal reflux disease without esophagitis: Secondary | ICD-10-CM | POA: Insufficient documentation

## 2014-06-19 DIAGNOSIS — I10 Essential (primary) hypertension: Secondary | ICD-10-CM | POA: Diagnosis not present

## 2014-06-19 DIAGNOSIS — W01198A Fall on same level from slipping, tripping and stumbling with subsequent striking against other object, initial encounter: Secondary | ICD-10-CM | POA: Diagnosis not present

## 2014-06-19 DIAGNOSIS — R42 Dizziness and giddiness: Secondary | ICD-10-CM | POA: Diagnosis not present

## 2014-06-19 DIAGNOSIS — Y998 Other external cause status: Secondary | ICD-10-CM | POA: Diagnosis not present

## 2014-06-19 DIAGNOSIS — Z79899 Other long term (current) drug therapy: Secondary | ICD-10-CM | POA: Diagnosis not present

## 2014-06-19 DIAGNOSIS — K5900 Constipation, unspecified: Secondary | ICD-10-CM | POA: Insufficient documentation

## 2014-06-19 DIAGNOSIS — I2510 Atherosclerotic heart disease of native coronary artery without angina pectoris: Secondary | ICD-10-CM | POA: Insufficient documentation

## 2014-06-19 DIAGNOSIS — S199XXA Unspecified injury of neck, initial encounter: Secondary | ICD-10-CM | POA: Diagnosis not present

## 2014-06-19 DIAGNOSIS — W19XXXA Unspecified fall, initial encounter: Secondary | ICD-10-CM

## 2014-06-19 LAB — BASIC METABOLIC PANEL
Anion gap: 9 (ref 5–15)
BUN: 9 mg/dL (ref 6–23)
CO2: 29 meq/L (ref 19–32)
Calcium: 9.5 mg/dL (ref 8.4–10.5)
Chloride: 97 mEq/L (ref 96–112)
Creatinine, Ser: 1 mg/dL (ref 0.50–1.35)
GFR calc Af Amer: 75 mL/min — ABNORMAL LOW (ref >90)
GFR calc non Af Amer: 65 mL/min — ABNORMAL LOW (ref >90)
GLUCOSE: 111 mg/dL — AB (ref 70–99)
POTASSIUM: 5.1 meq/L (ref 3.7–5.3)
SODIUM: 135 meq/L — AB (ref 137–147)

## 2014-06-19 LAB — CBC WITH DIFFERENTIAL/PLATELET
Basophils Absolute: 0 10*3/uL (ref 0.0–0.1)
Basophils Relative: 0 % (ref 0–1)
EOS ABS: 0.2 10*3/uL (ref 0.0–0.7)
Eosinophils Relative: 2 % (ref 0–5)
HCT: 34.8 % — ABNORMAL LOW (ref 39.0–52.0)
HEMOGLOBIN: 12.3 g/dL — AB (ref 13.0–17.0)
LYMPHS ABS: 1.4 10*3/uL (ref 0.7–4.0)
LYMPHS PCT: 21 % (ref 12–46)
MCH: 35.8 pg — AB (ref 26.0–34.0)
MCHC: 35.3 g/dL (ref 30.0–36.0)
MCV: 101.2 fL — AB (ref 78.0–100.0)
MONOS PCT: 13 % — AB (ref 3–12)
Monocytes Absolute: 0.9 10*3/uL (ref 0.1–1.0)
NEUTROS PCT: 64 % (ref 43–77)
Neutro Abs: 4.4 10*3/uL (ref 1.7–7.7)
PLATELETS: 325 10*3/uL (ref 150–400)
RBC: 3.44 MIL/uL — AB (ref 4.22–5.81)
RDW: 10.8 % — ABNORMAL LOW (ref 11.5–15.5)
WBC: 6.9 10*3/uL (ref 4.0–10.5)

## 2014-06-19 LAB — URINALYSIS, ROUTINE W REFLEX MICROSCOPIC
Bilirubin Urine: NEGATIVE
GLUCOSE, UA: NEGATIVE mg/dL
HGB URINE DIPSTICK: NEGATIVE
Ketones, ur: NEGATIVE mg/dL
LEUKOCYTES UA: NEGATIVE
Nitrite: NEGATIVE
PH: 6.5 (ref 5.0–8.0)
Protein, ur: NEGATIVE mg/dL
SPECIFIC GRAVITY, URINE: 1.006 (ref 1.005–1.030)
Urobilinogen, UA: 0.2 mg/dL (ref 0.0–1.0)

## 2014-06-19 NOTE — Discharge Instructions (Signed)
Dizziness   Dizziness means you feel unsteady or lightheaded. You might feel like you are going to pass out (faint).  HOME CARE   · Drink enough fluids to keep your pee (urine) clear or pale yellow.  · Take your medicines exactly as told by your doctor. If you take blood pressure medicine, always stand up slowly from the lying or sitting position. Hold on to something to steady yourself.  · If you need to stand in one place for a long time, move your legs often. Tighten and relax your leg muscles.  · Have someone stay with you until you feel okay.  · Do not drive or use heavy machinery if you feel dizzy.  · Do not drink alcohol.  GET HELP RIGHT AWAY IF:   · You feel dizzy or lightheaded and it gets worse.  · You feel sick to your stomach (nauseous), or you throw up (vomit).  · You have trouble talking or walking.  · You feel weak or have trouble using your arms, hands, or legs.  · You cannot think clearly or have trouble forming sentences.  · You have chest pain, belly (abdominal) pain, sweating, or you are short of breath.  · Your vision changes.  · You are bleeding.  · You have problems from your medicine that seem to be getting worse.  MAKE SURE YOU:   · Understand these instructions.  · Will watch your condition.  · Will get help right away if you are not doing well or get worse.  Document Released: 07/05/2011 Document Revised: 10/08/2011 Document Reviewed: 07/05/2011  [employer]® Patient Information ©2015 [employer]. This information is not intended to replace advice given to you by your health care provider. Make sure you discuss any questions you have with your health care provider.

## 2014-06-19 NOTE — ED Notes (Signed)
[facility] called to take pt back to [place]. RN aware

## 2014-06-19 NOTE — ED Provider Notes (Signed)
CSN: [id number]     Arrival date & time 06/19/14  1332 History   First MD Initiated Contact with Patient 06/19/14 1343     Chief Complaint  Patient presents with  . Fall     (Consider location/radiation/quality/duration/timing/severity/associated sxs/prior Treatment) HPI Comments: Also pt noted that recently his medications have changed and wondering if that has something to do with his symptoms.  Patient is a 80 y.o. male presenting with fall. The history is provided by the patient.  Fall This is a new problem. The current episode started 1 to 2 hours ago. The problem occurs constantly. The problem has been resolved. Pertinent negatives include no chest pain and no abdominal pain. Associated symptoms comments: Pt states for the last few days he has been having dizzy spells.  It always occurs after he has stood up.  He feels lightheaded but denies syncope.  Today after having a large BM he stood up and became dizzy and fell against the wall hitting his head and neck.  He was able to walk to the dining room and then his facility sent him here for further care.. The symptoms are aggravated by standing and walking. The symptoms are relieved by rest. He has tried nothing for the symptoms. The treatment provided no relief.    Past Medical History  Diagnosis Date  . GERD (gastroesophageal reflux disease)   . Hypertension   . CAD (coronary artery disease)    History reviewed. No pertinent past surgical history. No family history on file. History  Substance Use Topics  . Smoking status: Never Smoker   . Smokeless tobacco: Not on file  . Alcohol Use: No    Review of Systems  Cardiovascular: Negative for chest pain.  Gastrointestinal: Positive for constipation. Negative for abdominal pain.       Had not had a BM for 2 days but had large stool this morning  Genitourinary: Negative for dysuria.  All other systems reviewed and are negative.     Allergies  Codeine  Home Medications    Prior to Admission medications   Medication Sig Start Date End Date Taking? Authorizing Provider  Artificial Tear Ointment (REFRESH P.M. OP) Apply to eye.    Historical Provider, MD  aspirin EC 81 MG tablet Take 81 mg by mouth daily.    Historical Provider, MD  cephALEXin (KEFLEX) 500 MG capsule Take 1 capsule (500 mg total) by mouth 4 (four) times daily. 05/31/14   Gilda Creasehristopher J. Pollina, MD  cetirizine (ZYRTEC) 10 MG tablet Take 10 mg by mouth daily.    Historical Provider, MD  cholecalciferol (VITAMIN D) 1000 UNITS tablet Take 1,000 Units by mouth daily. Chew after a meal    Historical Provider, MD  docusate sodium (COLACE) 100 MG capsule Take 100 mg by mouth daily. Hold for loose stools    Historical Provider, MD  doxazosin (CARDURA) 8 MG tablet Take 4 mg by mouth at bedtime.    Historical Provider, MD  finasteride (PROSCAR) 5 MG tablet Take 5 mg by mouth daily.    Historical Provider, MD  metoprolol tartrate (LOPRESSOR) 25 MG tablet Take 6.25 mg by mouth 2 (two) times daily. 1/4 tablet    Historical Provider, MD  pantoprazole (PROTONIX) 40 MG tablet Take 40 mg by mouth daily.    Historical Provider, MD  phenylephrine (HEMORRHOIDAL) 0.25 % suppository Place 1 suppository rectally 2 (two) times daily.    Historical Provider, MD  polyethylene glycol (MIRALAX / GLYCOLAX) packet Take 17 g by  mouth daily.    Historical Provider, MD  tamsulosin (FLOMAX) 0.4 MG CAPS capsule Take 0.4 mg by mouth daily.    Historical Provider, MD   BP 162/70 mmHg  Pulse 65  Temp(Src) 97.7 F (36.5 C) (Oral)  Resp 18  Ht 5\' 7"  (1.702 m)  Wt 155 lb (70.308 kg)  BMI 24.27 kg/m2  SpO2 100% Physical Exam  Constitutional: He is oriented to person, place, and time. He appears well-developed and well-nourished. No distress.  HENT:  Head: Normocephalic and atraumatic.    Mouth/Throat: Oropharynx is clear and moist.  Eyes: Conjunctivae and EOM are normal. Pupils are equal, round, and reactive to light.  Neck:  Normal range of motion. Neck supple. Spinous process tenderness and muscular tenderness present.    Cardiovascular: Normal rate, regular rhythm and intact distal pulses.   No murmur heard. Pulmonary/Chest: Effort normal and breath sounds normal. No respiratory distress. He has no wheezes. He has no rales.  Abdominal: Soft. He exhibits no distension. There is no tenderness. There is no rebound and no guarding.  Musculoskeletal: Normal range of motion. He exhibits no edema or tenderness.       Arms: Neurological: He is alert and oriented to person, place, and time. He has normal strength. No sensory deficit.  Normal heel to shin testing and normal finger to nose.  No visual field cuts  Skin: Skin is warm and dry. No rash noted. No erythema.  Psychiatric: He has a normal mood and affect. His behavior is normal.  Nursing note and vitals reviewed.   ED Course  Procedures (including critical care time) Labs Review Labs Reviewed  CBC WITH DIFFERENTIAL - Abnormal; Notable for the following:    RBC 3.44 (*)    Hemoglobin 12.3 (*)    HCT 34.8 (*)    MCV 101.2 (*)    MCH 35.8 (*)    RDW 10.8 (*)    Monocytes Relative 13 (*)    All other components within normal limits  BASIC METABOLIC PANEL - Abnormal; Notable for the following:    Sodium 135 (*)    Glucose, Bld 111 (*)    GFR calc non Af Amer 65 (*)    GFR calc Af Amer 75 (*)    All other components within normal limits  URINALYSIS, ROUTINE W REFLEX MICROSCOPIC    Imaging Review Dg Chest 2 View  06/19/2014   CLINICAL DATA:  Fall after standing up to use restroom,struck head on wall, neck soreness, nonsmoker, history hypertension, coronary artery disease, GERD  EXAM: CHEST  2 VIEW  COMPARISON:  11/20/2003  FINDINGS: Upper normal heart size.  Normal pulmonary vascularity.  Tortuous thoracic aorta.  Lungs clear.  No pleural effusion or pneumothorax.  Osseous demineralization.  Scattered degenerative disc disease changes thoracic spine.   Minimal age-indeterminate height loss of adjacent mid thoracic vertebrae.  IMPRESSION: No acute cardiopulmonary abnormalities.  Osseous demineralization with minimal superior endplate height loss of adjacent mid thoracic vertebrae.   Electronically Signed   By: Ulyses Southward M.D.   On: 06/19/2014 15:00   Ct Head Wo Contrast  06/19/2014   CLINICAL DATA:  Patient states he fell and hit the left side of his head today. States neck twisted as he fell and is having "neck pain all over"  EXAM: CT HEAD WITHOUT CONTRAST  CT CERVICAL SPINE WITHOUT CONTRAST  TECHNIQUE: Multidetector CT imaging of the head and cervical spine was performed following the standard protocol without intravenous contrast. Multiplanar CT image  reconstructions of the cervical spine were also generated.  COMPARISON:  Neck CT 09/02/2012  FINDINGS: CT HEAD FINDINGS  No acute intracranial hemorrhage. No focal mass lesion. No CT evidence of acute infarction. No midline shift or mass effect. No hydrocephalus. No intracranial hemorrhage. No parenchymal contusion. No midline shift or mass effect. Basilar cisterns are patent. No skull base fracture. No fluid in the paranasal sinuses or mastoid air cells. Orbits are normal.  There is generalized cortical atrophy. Mild periventricular subcortical white matter hypodensities  CT CERVICAL SPINE FINDINGS  No prevertebral soft tissue swelling. Normal alignment of cervical vertebral bodies. No loss of vertebral body height. Normal facet articulation. Normal craniocervical junction.  No evidence epidural or paraspinal hematoma.  There are multiple levels of endplate narrowing and associated endplate osteophytosis. These findings are not changed from 2 comparison CT. There is diffuse sclerotic change throughout the bones which is felt to represent advanced osteoporosis. No clear evidence vertebral body fracture or neural arch fracture.  IMPRESSION: 1. No intracranial trauma. 2. 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 3. No CT evidence of cervical spine fracture. 4. Multiple levels of disc osteophytic disease unchanged prior. 5. Osteoporosis   Electronically Signed   By: Genevive Bi M.D.   On: 06/19/2014 15:06   Ct Cervical Spine Wo Contrast  06/19/2014   CLINICAL DATA:  Patient states he fell and hit the left side of his head today. States neck twisted as he fell and is having "neck pain all over"  EXAM: CT HEAD WITHOUT CONTRAST  CT CERVICAL SPINE WITHOUT CONTRAST  TECHNIQUE: Multidetector CT imaging of the head and cervical spine was performed following the standard protocol without intravenous contrast. Multiplanar CT image reconstructions of the cervical spine were also generated.  COMPARISON:  Neck CT 09/02/2012  FINDINGS: CT HEAD FINDINGS  No acute intracranial hemorrhage. No focal mass lesion. No CT evidence of acute infarction. No midline shift or mass effect. No hydrocephalus. No intracranial hemorrhage. No parenchymal contusion. No midline shift or mass effect. Basilar cisterns are patent. No skull base fracture. No fluid in the paranasal sinuses or mastoid air cells. Orbits are normal.  There is generalized cortical atrophy. Mild periventricular subcortical white matter hypodensities  CT CERVICAL SPINE FINDINGS  No prevertebral soft tissue swelling. Normal alignment of cervical vertebral bodies. No loss of vertebral body height. Normal facet articulation. Normal craniocervical junction.  No evidence epidural or paraspinal hematoma.  There are multiple levels of endplate narrowing and associated endplate osteophytosis. These findings are not changed from 2 comparison CT. There is diffuse sclerotic change throughout the bones which is felt to represent advanced osteoporosis. No clear evidence vertebral body fracture or neural arch fracture.  IMPRESSION: 1. No intracranial trauma. 2. Cortical atrophy and white matter microvascular disease. 3. No CT evidence of cervical spine fracture. 4.  Multiple levels of disc osteophytic disease unchanged prior. 5. Osteoporosis   Electronically Signed   By: Genevive Bi M.D.   On: 06/19/2014 15:06     EKG Interpretation   Date/Time:  Saturday June 19 2014 14:46:13 EST Ventricular Rate:  70 PR Interval:  164 QRS Duration: 90 QT Interval:  392 QTC Calculation: 423 R Axis:   -14 Text Interpretation:  Normal sinus rhythm Cannot rule out Anterior infarct  , age undetermined Atrial fibrillation RESOLVED SINCE PREVIOUS Confirmed  by Anitra Lauth  MD, Alphonzo Lemmings ([id number]) on 06/19/2014 2:49:09 PM      MDM   Final diagnoses:  Fall  Episodic lightheadedness  Patient presenting with a fall today where he hit his head on the side of the wall skin tear on his arm. He states over the last few weeks he's had dizziness with standing. He states recently his medications have been changed and thinks that that may be a cause. He is in no acute distress at this time and denies any dizziness with sitting. His symptoms seem to be orthostatic in nature. He denies any symptoms that sound vertiginous or strokelike. Patient has a normal neurologic exam. He is complaining of neck pain and headaches since his fall.  Patient does take Cardura and Lopressor. It is unclear how long he has been on those medications or if there are new. Chest x-ray,CT of head, neck pending. CBC, BMP, UA pending to evaluate other causes for his symptoms. EKG is within normal limits.  3:12 PM Imaging neg.  With orthostatics HR increases by 15 but BP does not drop.  Looking at med rec pt is only taking flomax and finasteride which could cause dizziness but no longer on cardura or lopressor.  4:02 PM UA normal.  wil have f/u with pcp if sx worsen. Discussed getting up slowly as well as drinking plenty of water.  Gwyneth SproutWhitney Treveon Bourcier, MD 06/19/14 [phone]

## 2014-06-19 NOTE — ED Notes (Addendum)
Patient fell after standing up from using the restroom. Denies any LOC, states he hit his head on the wall and is having neck soreness. C/o arm lac

## 2014-07-31 ENCOUNTER — Emergency Department (HOSPITAL_BASED_OUTPATIENT_CLINIC_OR_DEPARTMENT_OTHER)
Admission: EM | Admit: 2014-07-31 | Discharge: 2014-07-31 | Disposition: A | Discharge status: Home / Self Care | Payer: Medicare Other | Attending: Emergency Medicine | Admitting: Emergency Medicine

## 2014-07-31 ENCOUNTER — Encounter (HOSPITAL_BASED_OUTPATIENT_CLINIC_OR_DEPARTMENT_OTHER): Payer: Self-pay | Admitting: *Deleted

## 2014-07-31 ENCOUNTER — Emergency Department (HOSPITAL_BASED_OUTPATIENT_CLINIC_OR_DEPARTMENT_OTHER): Payer: Medicare Other

## 2014-07-31 DIAGNOSIS — J811 Chronic pulmonary edema: Secondary | ICD-10-CM | POA: Diagnosis not present

## 2014-07-31 DIAGNOSIS — Z7982 Long term (current) use of aspirin: Secondary | ICD-10-CM | POA: Diagnosis not present

## 2014-07-31 DIAGNOSIS — K219 Gastro-esophageal reflux disease without esophagitis: Secondary | ICD-10-CM | POA: Insufficient documentation

## 2014-07-31 DIAGNOSIS — F17200 Nicotine dependence, unspecified, uncomplicated: Secondary | ICD-10-CM | POA: Diagnosis not present

## 2014-07-31 DIAGNOSIS — I10 Essential (primary) hypertension: Secondary | ICD-10-CM | POA: Insufficient documentation

## 2014-07-31 DIAGNOSIS — Z792 Long term (current) use of antibiotics: Secondary | ICD-10-CM | POA: Diagnosis not present

## 2014-07-31 DIAGNOSIS — I2510 Atherosclerotic heart disease of native coronary artery without angina pectoris: Secondary | ICD-10-CM | POA: Diagnosis not present

## 2014-07-31 DIAGNOSIS — Z79899 Other long term (current) drug therapy: Secondary | ICD-10-CM | POA: Diagnosis not present

## 2014-07-31 DIAGNOSIS — R05 Cough: Secondary | ICD-10-CM | POA: Diagnosis not present

## 2014-07-31 DIAGNOSIS — R0602 Shortness of breath: Secondary | ICD-10-CM | POA: Diagnosis not present

## 2014-07-31 DIAGNOSIS — R069 Unspecified abnormalities of breathing: Secondary | ICD-10-CM | POA: Diagnosis not present

## 2014-07-31 DIAGNOSIS — R0981 Nasal congestion: Secondary | ICD-10-CM | POA: Insufficient documentation

## 2014-07-31 MED ORDER — FLUTICASONE PROPIONATE 50 MCG/ACT NA SUSP: 2.0000 | Freq: Every day | NASAL | Status: AC

## 2014-07-31 MED ORDER — SALINE SPRAY 0.65 % NA SOLN: 1.0000 | NASAL | Status: DC | PRN

## 2014-07-31 NOTE — ED Notes (Signed)
Patient transported via [facility]. 

## 2014-07-31 NOTE — ED Notes (Signed)
C/o nasal congestion and sneezing  for past several days. States the nasal spray they are giving him at the assisted living is not working (not sure of name) denies any fevers. C/o throat hurting. Denies any sob. resp even and unlabored on exam.  Pt arrived by EMS from [facility].

## 2014-07-31 NOTE — ED Provider Notes (Signed)
CSN: [id number]     Arrival date & time 07/31/14  0419 History   First MD Initiated Contact with Patient 07/31/14 0700     Chief Complaint  Patient presents with  . nasal congestion      (Consider location/radiation/quality/duration/timing/severity/associated sxs/prior Treatment) HPI  81 year old male presents with nasal congestion and sore throat for the past 1-2 weeks. Occasional sneezing. States he's been given a spray at his facility without any relief. Patient states she's never had congestion this bad. He is unable to breathe through his nose. No specific shortness of breath. Has intermittent mild cough. No headache, chest pain, fevers, or abdominal pain. No leg swelling. No rhinorrhea.   Past Medical History  Diagnosis Date  . GERD (gastroesophageal reflux disease)   . Hypertension   . CAD (coronary artery disease)    Past Surgical History  Procedure Laterality Date  . Tonsillectomy     No family history on file. History  Substance Use Topics  . Smoking status: Never Smoker   . Smokeless tobacco: Not on file  . Alcohol Use: No    Review of Systems  Constitutional: Negative for fever.  HENT: Positive for congestion, sneezing and sore throat.   Respiratory: Positive for cough. Negative for shortness of breath.   Cardiovascular: Negative for chest pain and leg swelling.  Gastrointestinal: Negative for vomiting and abdominal pain.  All other systems reviewed and are negative.     Allergies  Codeine  Home Medications   Prior to Admission medications   Medication Sig Start Date End Date Taking? Authorizing Provider  ipratropium (ATROVENT) 0.03 % nasal spray Place 2 sprays into both nostrils 2 (two) times daily.   Yes Historical Provider, MD  methocarbamol (ROBAXIN) 500 MG tablet Take 500 mg by mouth as needed for muscle spasms.   Yes Historical Provider, MD  Artificial Tear Ointment (REFRESH P.M. OP) Apply to eye.    Historical Provider, MD  aspirin EC 81 MG tablet  Take 81 mg by mouth daily.    Historical Provider, MD  cephALEXin (KEFLEX) 500 MG capsule Take 1 capsule (500 mg total) by mouth 4 (four) times daily. 05/31/14   Gilda Crease, MD  cetirizine (ZYRTEC) 10 MG tablet Take 10 mg by mouth daily.    Historical Provider, MD  cholecalciferol (VITAMIN D) 1000 UNITS tablet Take 1,000 Units by mouth daily. Chew after a meal    Historical Provider, MD  docusate sodium (COLACE) 100 MG capsule Take 100 mg by mouth daily. Hold for loose stools    Historical Provider, MD  doxazosin (CARDURA) 8 MG tablet Take 4 mg by mouth at bedtime.    Historical Provider, MD  finasteride (PROSCAR) 5 MG tablet Take 5 mg by mouth daily.    Historical Provider, MD  metoprolol tartrate (LOPRESSOR) 25 MG tablet Take 6.25 mg by mouth 2 (two) times daily. 1/4 tablet    Historical Provider, MD  pantoprazole (PROTONIX) 40 MG tablet Take 40 mg by mouth daily.    Historical Provider, MD  phenylephrine (HEMORRHOIDAL) 0.25 % suppository Place 1 suppository rectally 2 (two) times daily.    Historical Provider, MD  polyethylene glycol (MIRALAX / GLYCOLAX) packet Take 17 g by mouth daily.    Historical Provider, MD  tamsulosin (FLOMAX) 0.4 MG CAPS capsule Take 0.4 mg by mouth daily.    Historical Provider, MD   BP 152/72 mmHg  Pulse 52  Temp(Src) 98 F (36.7 C) (Oral)  Resp 14  Ht  (1.702 m)  Wt 155 lb (70.308 kg)  BMI 24.27 kg/m2  SpO2 97% Physical Exam  Constitutional: He is oriented to person, place, and time. He appears well-developed and well-nourished.  HENT:  Head: Normocephalic and atraumatic.  Right Ear: External ear normal.  Left Ear: External ear normal.  Nose: Nose normal. No rhinorrhea.  Mouth/Throat: Oropharynx is clear and moist. No oropharyngeal exudate.  Nasal congestion bilaterally  Eyes: Right eye exhibits no discharge. Left eye exhibits no discharge.  Neck: Neck supple.  Cardiovascular: Normal rate, regular rhythm, normal heart sounds and intact  distal pulses.   Pulmonary/Chest: Effort normal and breath sounds normal. He has no wheezes. He has no rales.  Abdominal: Soft. He exhibits no distension. There is no tenderness.  Musculoskeletal: He exhibits no edema or tenderness.  Neurological: He is alert and oriented to person, place, and time.  Skin: Skin is warm and dry.  Nursing note and vitals reviewed.   ED Course  Procedures (including critical care time) Labs Review Labs Reviewed - No data to display  Imaging Review Dg Chest 2 View  07/31/2014   CLINICAL DATA:  Acute cough, nasal congestion, sneezing, shortness of breath. Former long-time smoker. Current history of hypertension in coronary artery disease.  EXAM: CHEST  2 VIEW  COMPARISON:  06/19/2014.  Portable 11/20/2003.  FINDINGS: Cardiac silhouette normal in size, unchanged. Thoracic aorta tortuous and mildly atherosclerotic, unchanged. Hilar and mediastinal contours otherwise unremarkable. Lungs clear. Bronchovascular markings normal. Pulmonary vascularity normal. No visible pleural effusions. No pneumothorax. Mild eventration of right anterior hemidiaphragm, unchanged. Degenerative changes throughout the thoracic spine.  IMPRESSION: No acute cardiopulmonary disease.   Electronically Signed   By: Hulan Saas M.D.   On: 07/31/2014 08:06     EKG Interpretation None      MDM   Final diagnoses:  Nasal congestion    No evidence of pneumonia. Well appearing. Will recommend intranasal saline sprays and intranasal steroids for symptomatic care. Given age and history of hypertension will limit other oral therapies such as pseudoephedrine or afrin. May need prn antihistamines but would use low doses given age. Stable for discharge, follow up with PCP.    Audree Camel, MD 07/31/14 [phone]

## 2015-09-21 IMAGING — CT CT CERVICAL SPINE W/O CM
3 of 5 series · 12 of 33 positions shown, 14 images · non-contrast
Comparison: Neck CT 09/02/2012

CLINICAL DATA: Patient states he fell and hit the left side of his
head today. States neck twisted as he fell and is having "neck pain
all over"

EXAM:
CT HEAD WITHOUT CONTRAST
CT CERVICAL SPINE WITHOUT CONTRAST
TECHNIQUE: Multidetector CT imaging of the head and cervical spine was
performed following the standard protocol without intravenous
contrast. Multiplanar CT image reconstructions of the cervical spine
were also generated.

[Series 8: c_spine 2.0 coronal · coronal · 0.25mm/px · 3 of 47 slices shown]
[im 10/47  bone]
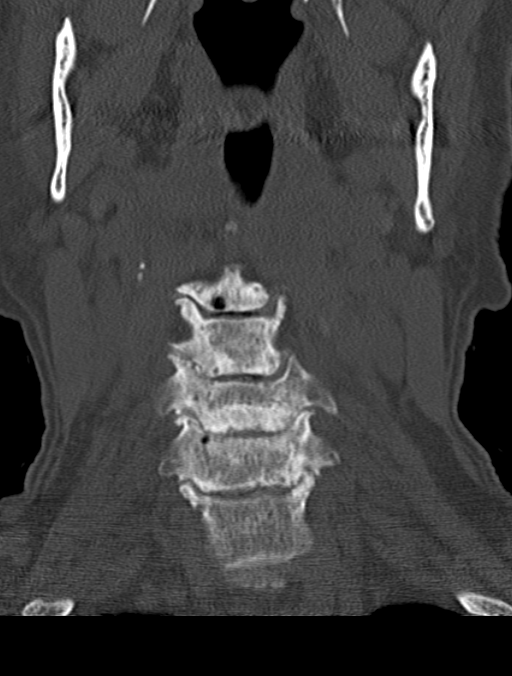
[im 19/47  bone]
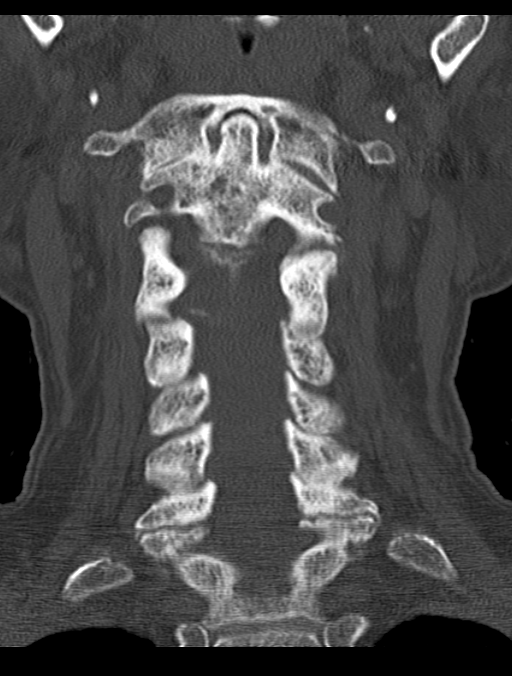
[im 28/47  bone]
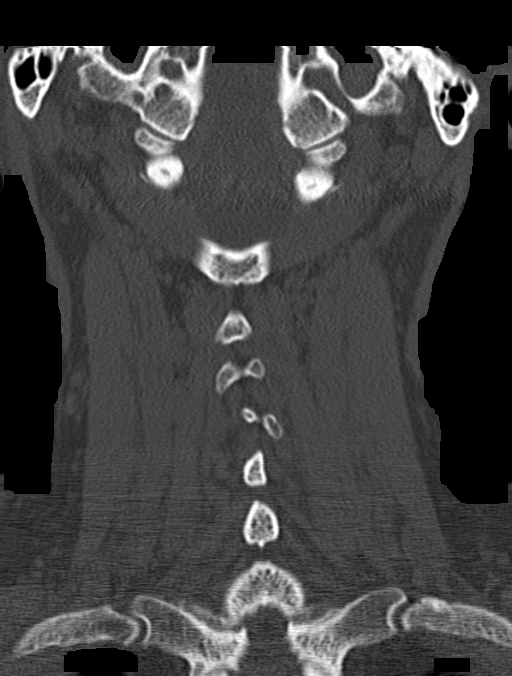

[Series 9: c_spine 2.0 sagittal · sagittal · 0.22mm/px · 5 of 41 slices shown, 6 images]
[im 14/41  bone]
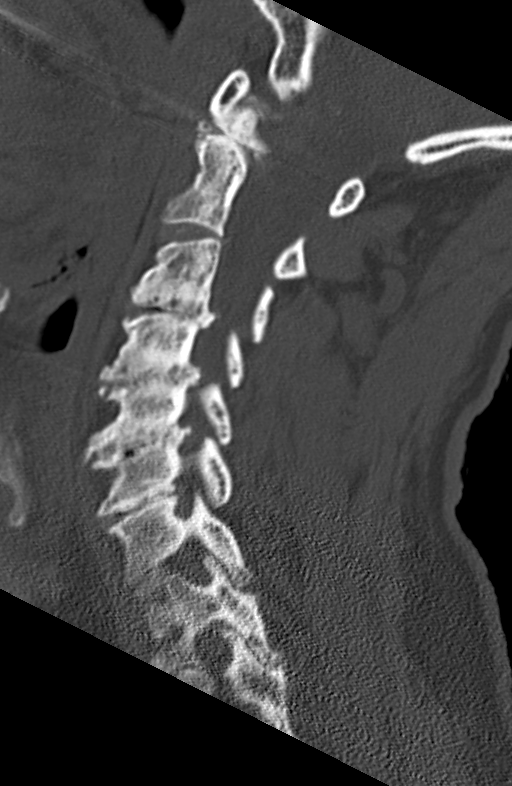
[im 17/41  bone]
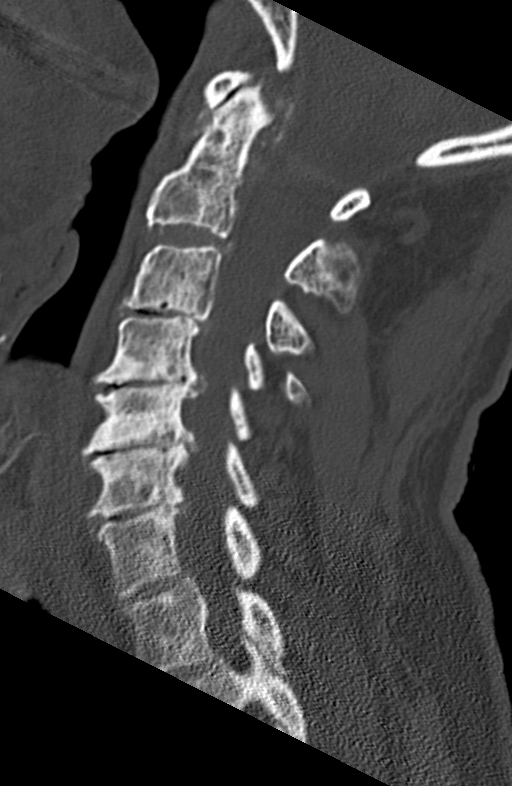
[im 21/41  soft-tissue]
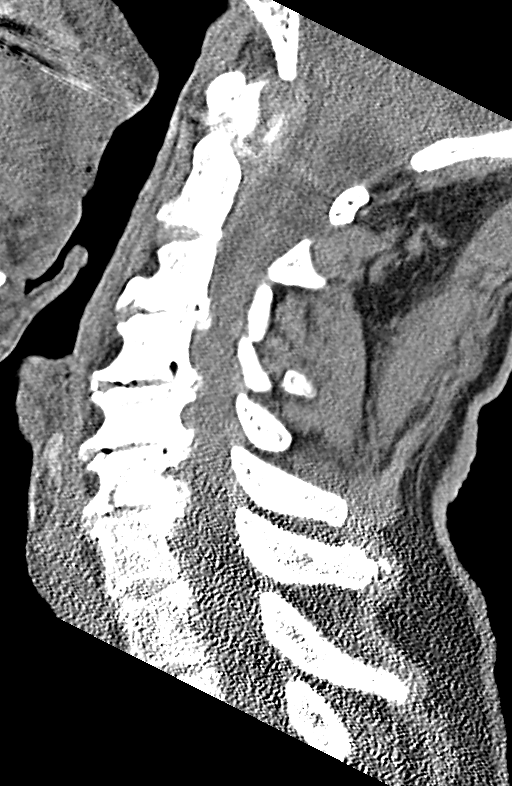
[im 21/41  bone]
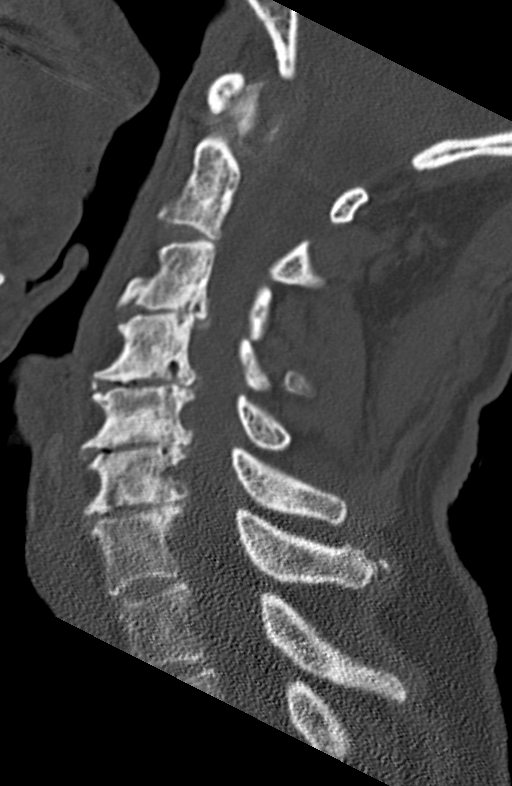
[im 24/41  bone]
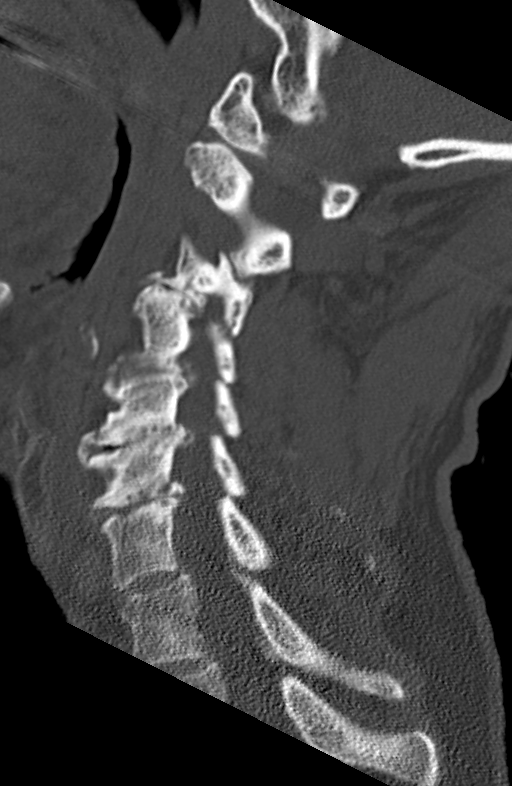
[im 27/41  bone]
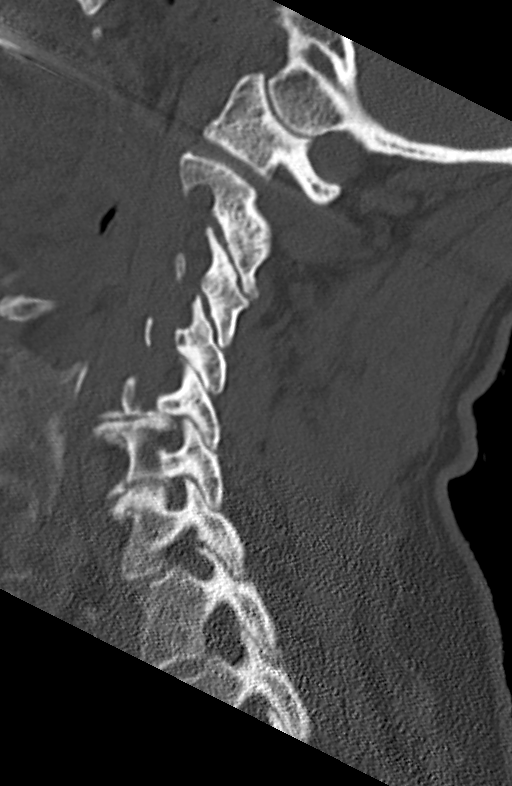

[Series 10: c_spine 2.0 orth ax · axial · 0.19mm/px · z∈[-307,-220]mm · 4 of 83 slices shown, 5 images]
[im 17/83  soft-tissue]
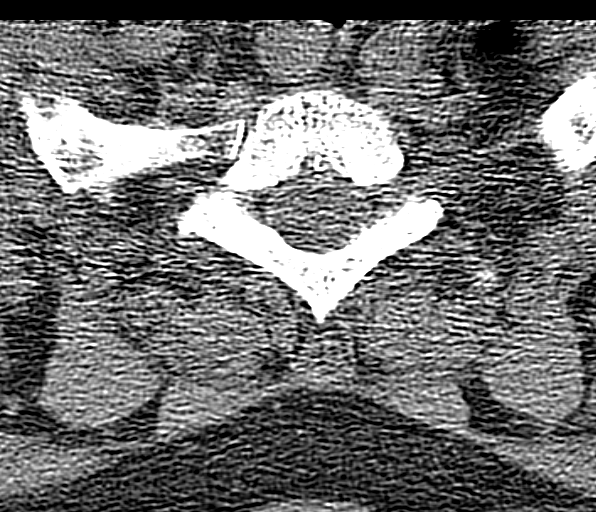
[im 17/83  bone]
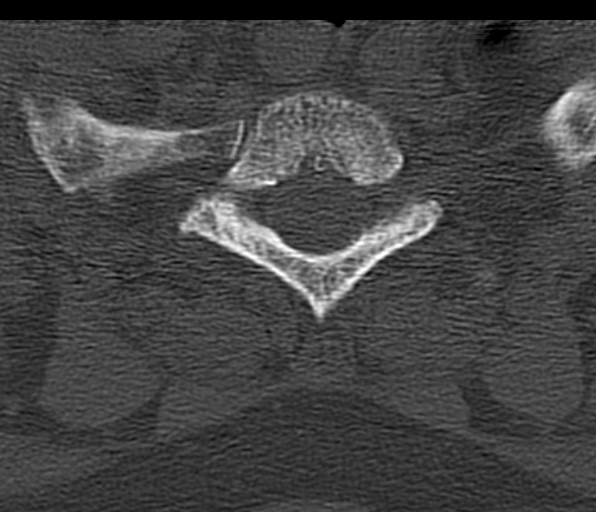
[im 33/83  bone]
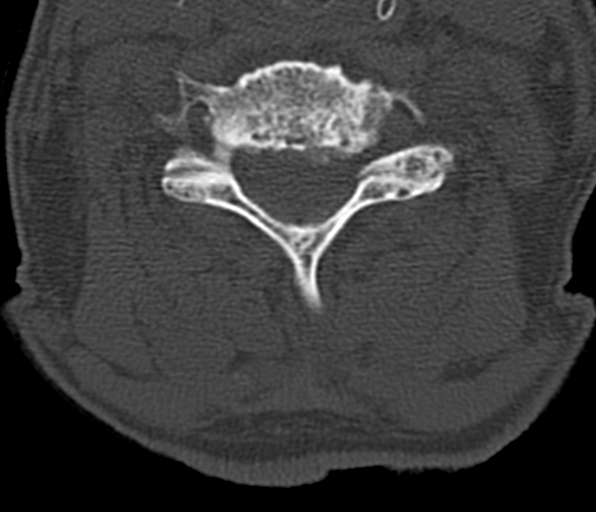
[im 50/83  bone]
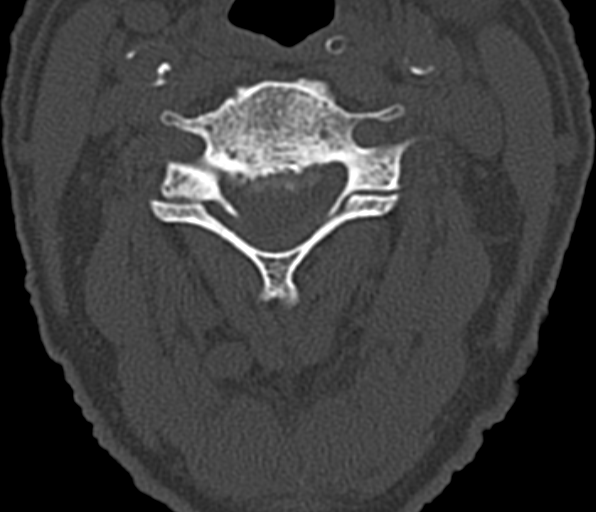
[im 66/83  bone]
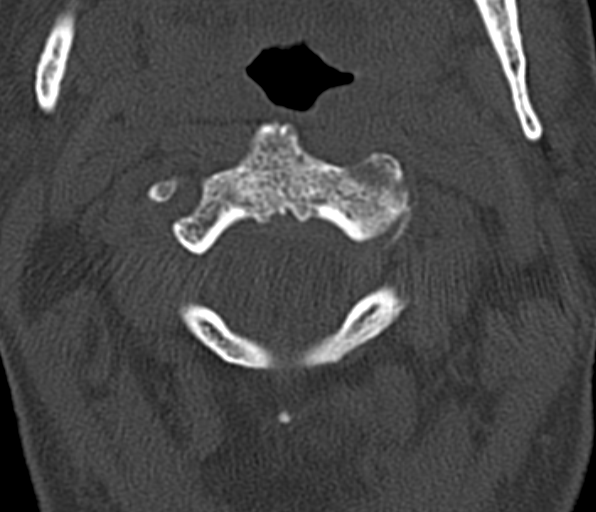

[12 of 33 positions shown; findings below may reference images not displayed]

FINDINGS: CT HEAD FINDINGS

No acute intracranial hemorrhage. No focal mass lesion. No CT
evidence of acute infarction. No midline shift or mass effect. No
hydrocephalus. No intracranial hemorrhage. No parenchymal contusion.
No midline shift or mass effect. Basilar cisterns are patent. No
skull base fracture. No fluid in the paranasal sinuses or mastoid
air cells. Orbits are normal.

There is generalized cortical atrophy. Mild periventricular
subcortical white matter hypodensities

CT CERVICAL SPINE FINDINGS

No prevertebral soft tissue swelling. Normal alignment of cervical
vertebral bodies. No loss of vertebral body height. Normal facet
articulation. Normal craniocervical junction.

No evidence epidural or paraspinal hematoma.

There are multiple levels of endplate narrowing and associated
endplate osteophytosis. These findings are not changed from 2
comparison CT. There is diffuse sclerotic change throughout the
bones which is felt to represent advanced osteoporosis. No clear
evidence vertebral body fracture or neural arch fracture.
IMPRESSION: 1. No intracranial trauma.
2. Cortical atrophy and white matter microvascular disease.
3. No CT evidence of cervical spine fracture.
4. Multiple levels of disc osteophytic disease unchanged prior.
5. Osteoporosis

## 2019-08-19 DIAGNOSIS — Z23 Encounter for immunization: Secondary | ICD-10-CM | POA: Diagnosis not present

## 2019-12-21 DIAGNOSIS — R079 Chest pain, unspecified: Secondary | ICD-10-CM | POA: Diagnosis not present

## 2019-12-21 DIAGNOSIS — I129 Hypertensive chronic kidney disease with stage 1 through stage 4 chronic kidney disease, or unspecified chronic kidney disease: Secondary | ICD-10-CM | POA: Diagnosis not present

## 2019-12-21 DIAGNOSIS — R1313 Dysphagia, pharyngeal phase: Secondary | ICD-10-CM | POA: Diagnosis not present

## 2019-12-21 DIAGNOSIS — R7309 Other abnormal glucose: Secondary | ICD-10-CM | POA: Diagnosis not present

## 2019-12-21 DIAGNOSIS — I083 Combined rheumatic disorders of mitral, aortic and tricuspid valves: Secondary | ICD-10-CM | POA: Diagnosis not present

## 2019-12-21 DIAGNOSIS — M4800 Spinal stenosis, site unspecified: Secondary | ICD-10-CM | POA: Diagnosis not present

## 2019-12-21 DIAGNOSIS — I10 Essential (primary) hypertension: Secondary | ICD-10-CM | POA: Diagnosis not present

## 2019-12-21 DIAGNOSIS — Z20822 Contact with and (suspected) exposure to covid-19: Secondary | ICD-10-CM | POA: Diagnosis not present

## 2019-12-21 DIAGNOSIS — I6523 Occlusion and stenosis of bilateral carotid arteries: Secondary | ICD-10-CM | POA: Diagnosis not present

## 2019-12-21 DIAGNOSIS — K219 Gastro-esophageal reflux disease without esophagitis: Secondary | ICD-10-CM | POA: Diagnosis not present

## 2019-12-21 DIAGNOSIS — Z09 Encounter for follow-up examination after completed treatment for conditions other than malignant neoplasm: Secondary | ICD-10-CM | POA: Diagnosis not present

## 2019-12-21 DIAGNOSIS — I4891 Unspecified atrial fibrillation: Secondary | ICD-10-CM | POA: Diagnosis not present

## 2019-12-21 DIAGNOSIS — I517 Cardiomegaly: Secondary | ICD-10-CM | POA: Diagnosis not present

## 2019-12-21 DIAGNOSIS — Z79899 Other long term (current) drug therapy: Secondary | ICD-10-CM | POA: Diagnosis not present

## 2019-12-21 DIAGNOSIS — I639 Cerebral infarction, unspecified: Secondary | ICD-10-CM | POA: Diagnosis not present

## 2019-12-21 DIAGNOSIS — I69352 Hemiplegia and hemiparesis following cerebral infarction affecting left dominant side: Secondary | ICD-10-CM | POA: Diagnosis not present

## 2019-12-21 DIAGNOSIS — R29810 Facial weakness: Secondary | ICD-10-CM | POA: Diagnosis not present

## 2019-12-21 DIAGNOSIS — I69391 Dysphagia following cerebral infarction: Secondary | ICD-10-CM | POA: Diagnosis not present

## 2019-12-21 DIAGNOSIS — Z8673 Personal history of transient ischemic attack (TIA), and cerebral infarction without residual deficits: Secondary | ICD-10-CM | POA: Diagnosis not present

## 2019-12-21 DIAGNOSIS — D649 Anemia, unspecified: Secondary | ICD-10-CM | POA: Diagnosis not present

## 2019-12-21 DIAGNOSIS — N1832 Chronic kidney disease, stage 3b: Secondary | ICD-10-CM | POA: Diagnosis not present

## 2019-12-21 DIAGNOSIS — R05 Cough: Secondary | ICD-10-CM | POA: Diagnosis not present

## 2019-12-21 DIAGNOSIS — R1310 Dysphagia, unspecified: Secondary | ICD-10-CM | POA: Diagnosis not present

## 2019-12-21 DIAGNOSIS — Z7982 Long term (current) use of aspirin: Secondary | ICD-10-CM | POA: Diagnosis not present

## 2019-12-21 DIAGNOSIS — Z7901 Long term (current) use of anticoagulants: Secondary | ICD-10-CM | POA: Diagnosis not present

## 2019-12-21 DIAGNOSIS — I69322 Dysarthria following cerebral infarction: Secondary | ICD-10-CM | POA: Diagnosis not present

## 2019-12-21 DIAGNOSIS — G459 Transient cerebral ischemic attack, unspecified: Secondary | ICD-10-CM | POA: Diagnosis not present

## 2019-12-25 DIAGNOSIS — R7309 Other abnormal glucose: Secondary | ICD-10-CM | POA: Diagnosis not present

## 2019-12-25 DIAGNOSIS — D649 Anemia, unspecified: Secondary | ICD-10-CM | POA: Diagnosis not present

## 2019-12-25 DIAGNOSIS — Z7901 Long term (current) use of anticoagulants: Secondary | ICD-10-CM | POA: Diagnosis not present

## 2019-12-25 DIAGNOSIS — I129 Hypertensive chronic kidney disease with stage 1 through stage 4 chronic kidney disease, or unspecified chronic kidney disease: Secondary | ICD-10-CM | POA: Diagnosis not present

## 2019-12-25 DIAGNOSIS — I639 Cerebral infarction, unspecified: Secondary | ICD-10-CM | POA: Diagnosis not present

## 2019-12-25 DIAGNOSIS — I10 Essential (primary) hypertension: Secondary | ICD-10-CM | POA: Diagnosis not present

## 2019-12-25 DIAGNOSIS — N1832 Chronic kidney disease, stage 3b: Secondary | ICD-10-CM | POA: Diagnosis not present

## 2019-12-25 DIAGNOSIS — I6340 Cerebral infarction due to embolism of unspecified cerebral artery: Secondary | ICD-10-CM | POA: Diagnosis not present

## 2019-12-25 DIAGNOSIS — Z7982 Long term (current) use of aspirin: Secondary | ICD-10-CM | POA: Diagnosis not present

## 2019-12-25 DIAGNOSIS — R5381 Other malaise: Secondary | ICD-10-CM | POA: Diagnosis not present

## 2019-12-25 DIAGNOSIS — I69928 Other speech and language deficits following unspecified cerebrovascular disease: Secondary | ICD-10-CM | POA: Diagnosis not present

## 2019-12-25 DIAGNOSIS — E559 Vitamin D deficiency, unspecified: Secondary | ICD-10-CM | POA: Diagnosis not present

## 2019-12-25 DIAGNOSIS — M6281 Muscle weakness (generalized): Secondary | ICD-10-CM | POA: Diagnosis not present

## 2019-12-25 DIAGNOSIS — M4800 Spinal stenosis, site unspecified: Secondary | ICD-10-CM | POA: Diagnosis not present

## 2019-12-25 DIAGNOSIS — I69352 Hemiplegia and hemiparesis following cerebral infarction affecting left dominant side: Secondary | ICD-10-CM | POA: Diagnosis not present

## 2019-12-25 DIAGNOSIS — R1313 Dysphagia, pharyngeal phase: Secondary | ICD-10-CM | POA: Diagnosis not present

## 2019-12-25 DIAGNOSIS — D519 Vitamin B12 deficiency anemia, unspecified: Secondary | ICD-10-CM | POA: Diagnosis not present

## 2019-12-25 DIAGNOSIS — R1314 Dysphagia, pharyngoesophageal phase: Secondary | ICD-10-CM | POA: Diagnosis not present

## 2019-12-25 DIAGNOSIS — G9009 Other idiopathic peripheral autonomic neuropathy: Secondary | ICD-10-CM | POA: Diagnosis not present

## 2019-12-25 DIAGNOSIS — E785 Hyperlipidemia, unspecified: Secondary | ICD-10-CM | POA: Diagnosis not present

## 2019-12-25 DIAGNOSIS — I69391 Dysphagia following cerebral infarction: Secondary | ICD-10-CM | POA: Diagnosis not present

## 2019-12-25 DIAGNOSIS — R2681 Unsteadiness on feet: Secondary | ICD-10-CM | POA: Diagnosis not present

## 2019-12-25 DIAGNOSIS — I69891 Dysphagia following other cerebrovascular disease: Secondary | ICD-10-CM | POA: Diagnosis not present

## 2019-12-25 DIAGNOSIS — I482 Chronic atrial fibrillation, unspecified: Secondary | ICD-10-CM | POA: Diagnosis not present

## 2019-12-25 DIAGNOSIS — I4891 Unspecified atrial fibrillation: Secondary | ICD-10-CM | POA: Diagnosis not present

## 2019-12-25 DIAGNOSIS — I69322 Dysarthria following cerebral infarction: Secondary | ICD-10-CM | POA: Diagnosis not present

## 2019-12-25 DIAGNOSIS — K219 Gastro-esophageal reflux disease without esophagitis: Secondary | ICD-10-CM | POA: Diagnosis not present

## 2020-01-04 DIAGNOSIS — I69352 Hemiplegia and hemiparesis following cerebral infarction affecting left dominant side: Secondary | ICD-10-CM | POA: Diagnosis not present

## 2020-01-04 DIAGNOSIS — I69391 Dysphagia following cerebral infarction: Secondary | ICD-10-CM | POA: Diagnosis not present

## 2020-01-04 DIAGNOSIS — I4891 Unspecified atrial fibrillation: Secondary | ICD-10-CM | POA: Diagnosis not present

## 2020-01-04 DIAGNOSIS — R5381 Other malaise: Secondary | ICD-10-CM | POA: Diagnosis not present

## 2020-01-11 DIAGNOSIS — R5381 Other malaise: Secondary | ICD-10-CM | POA: Diagnosis not present

## 2020-01-11 DIAGNOSIS — I69352 Hemiplegia and hemiparesis following cerebral infarction affecting left dominant side: Secondary | ICD-10-CM | POA: Diagnosis not present

## 2020-01-11 DIAGNOSIS — I69391 Dysphagia following cerebral infarction: Secondary | ICD-10-CM | POA: Diagnosis not present

## 2020-01-11 DIAGNOSIS — I4891 Unspecified atrial fibrillation: Secondary | ICD-10-CM | POA: Diagnosis not present

## 2020-01-16 DIAGNOSIS — I69928 Other speech and language deficits following unspecified cerebrovascular disease: Secondary | ICD-10-CM | POA: Diagnosis not present

## 2020-01-16 DIAGNOSIS — I482 Chronic atrial fibrillation, unspecified: Secondary | ICD-10-CM | POA: Diagnosis not present

## 2020-01-16 DIAGNOSIS — E559 Vitamin D deficiency, unspecified: Secondary | ICD-10-CM | POA: Diagnosis not present

## 2020-01-16 DIAGNOSIS — D519 Vitamin B12 deficiency anemia, unspecified: Secondary | ICD-10-CM | POA: Diagnosis not present

## 2020-01-16 DIAGNOSIS — D649 Anemia, unspecified: Secondary | ICD-10-CM | POA: Diagnosis not present

## 2020-01-16 DIAGNOSIS — I4891 Unspecified atrial fibrillation: Secondary | ICD-10-CM | POA: Diagnosis not present

## 2020-01-16 DIAGNOSIS — G9009 Other idiopathic peripheral autonomic neuropathy: Secondary | ICD-10-CM | POA: Diagnosis not present

## 2020-01-16 DIAGNOSIS — R400 Somnolence: Secondary | ICD-10-CM | POA: Diagnosis not present

## 2020-01-16 DIAGNOSIS — W19XXXA Unspecified fall, initial encounter: Secondary | ICD-10-CM | POA: Diagnosis not present

## 2020-01-16 DIAGNOSIS — M4800 Spinal stenosis, site unspecified: Secondary | ICD-10-CM | POA: Diagnosis not present

## 2020-01-16 DIAGNOSIS — I69391 Dysphagia following cerebral infarction: Secondary | ICD-10-CM | POA: Diagnosis not present

## 2020-01-16 DIAGNOSIS — I69322 Dysarthria following cerebral infarction: Secondary | ICD-10-CM | POA: Diagnosis not present

## 2020-01-16 DIAGNOSIS — I6340 Cerebral infarction due to embolism of unspecified cerebral artery: Secondary | ICD-10-CM | POA: Diagnosis not present

## 2020-01-16 DIAGNOSIS — E785 Hyperlipidemia, unspecified: Secondary | ICD-10-CM | POA: Diagnosis not present

## 2020-01-16 DIAGNOSIS — R0902 Hypoxemia: Secondary | ICD-10-CM | POA: Diagnosis not present

## 2020-01-16 DIAGNOSIS — R2681 Unsteadiness on feet: Secondary | ICD-10-CM | POA: Diagnosis not present

## 2020-01-16 DIAGNOSIS — M6281 Muscle weakness (generalized): Secondary | ICD-10-CM | POA: Diagnosis not present

## 2020-01-16 DIAGNOSIS — Z79899 Other long term (current) drug therapy: Secondary | ICD-10-CM | POA: Diagnosis not present

## 2020-01-16 DIAGNOSIS — I69891 Dysphagia following other cerebrovascular disease: Secondary | ICD-10-CM | POA: Diagnosis not present

## 2020-01-16 DIAGNOSIS — I69352 Hemiplegia and hemiparesis following cerebral infarction affecting left dominant side: Secondary | ICD-10-CM | POA: Diagnosis not present

## 2020-01-16 DIAGNOSIS — I129 Hypertensive chronic kidney disease with stage 1 through stage 4 chronic kidney disease, or unspecified chronic kidney disease: Secondary | ICD-10-CM | POA: Diagnosis not present

## 2020-01-16 DIAGNOSIS — N1832 Chronic kidney disease, stage 3b: Secondary | ICD-10-CM | POA: Diagnosis not present

## 2020-01-16 DIAGNOSIS — K219 Gastro-esophageal reflux disease without esophagitis: Secondary | ICD-10-CM | POA: Diagnosis not present

## 2020-01-16 DIAGNOSIS — K2100 Gastro-esophageal reflux disease with esophagitis, without bleeding: Secondary | ICD-10-CM | POA: Diagnosis not present

## 2020-01-16 DIAGNOSIS — R1314 Dysphagia, pharyngoesophageal phase: Secondary | ICD-10-CM | POA: Diagnosis not present

## 2020-01-16 DIAGNOSIS — M25551 Pain in right hip: Secondary | ICD-10-CM | POA: Diagnosis not present

## 2020-01-16 DIAGNOSIS — Z7982 Long term (current) use of aspirin: Secondary | ICD-10-CM | POA: Diagnosis not present

## 2020-01-16 DIAGNOSIS — G8192 Hemiplegia, unspecified affecting left dominant side: Secondary | ICD-10-CM | POA: Diagnosis not present

## 2020-01-16 DIAGNOSIS — R0989 Other specified symptoms and signs involving the circulatory and respiratory systems: Secondary | ICD-10-CM | POA: Diagnosis not present

## 2020-01-16 DIAGNOSIS — M25552 Pain in left hip: Secondary | ICD-10-CM | POA: Diagnosis not present

## 2020-01-16 DIAGNOSIS — I10 Essential (primary) hypertension: Secondary | ICD-10-CM | POA: Diagnosis not present

## 2020-01-16 DIAGNOSIS — D72829 Elevated white blood cell count, unspecified: Secondary | ICD-10-CM | POA: Diagnosis not present

## 2020-01-18 DIAGNOSIS — N1832 Chronic kidney disease, stage 3b: Secondary | ICD-10-CM | POA: Diagnosis not present

## 2020-01-18 DIAGNOSIS — K219 Gastro-esophageal reflux disease without esophagitis: Secondary | ICD-10-CM | POA: Diagnosis not present

## 2020-01-18 DIAGNOSIS — G8192 Hemiplegia, unspecified affecting left dominant side: Secondary | ICD-10-CM | POA: Diagnosis not present

## 2020-01-18 DIAGNOSIS — I482 Chronic atrial fibrillation, unspecified: Secondary | ICD-10-CM | POA: Diagnosis not present

## 2020-01-19 DIAGNOSIS — I482 Chronic atrial fibrillation, unspecified: Secondary | ICD-10-CM | POA: Diagnosis not present

## 2020-01-19 DIAGNOSIS — K219 Gastro-esophageal reflux disease without esophagitis: Secondary | ICD-10-CM | POA: Diagnosis not present

## 2020-01-19 DIAGNOSIS — G8192 Hemiplegia, unspecified affecting left dominant side: Secondary | ICD-10-CM | POA: Diagnosis not present

## 2020-01-19 DIAGNOSIS — N1832 Chronic kidney disease, stage 3b: Secondary | ICD-10-CM | POA: Diagnosis not present

## 2020-01-20 DIAGNOSIS — N1832 Chronic kidney disease, stage 3b: Secondary | ICD-10-CM | POA: Diagnosis not present

## 2020-01-20 DIAGNOSIS — R1314 Dysphagia, pharyngoesophageal phase: Secondary | ICD-10-CM | POA: Diagnosis not present

## 2020-01-20 DIAGNOSIS — I482 Chronic atrial fibrillation, unspecified: Secondary | ICD-10-CM | POA: Diagnosis not present

## 2020-01-26 DIAGNOSIS — N1832 Chronic kidney disease, stage 3b: Secondary | ICD-10-CM | POA: Diagnosis not present

## 2020-01-26 DIAGNOSIS — R1314 Dysphagia, pharyngoesophageal phase: Secondary | ICD-10-CM | POA: Diagnosis not present

## 2020-01-26 DIAGNOSIS — I482 Chronic atrial fibrillation, unspecified: Secondary | ICD-10-CM | POA: Diagnosis not present

## 2020-01-26 DIAGNOSIS — K2100 Gastro-esophageal reflux disease with esophagitis, without bleeding: Secondary | ICD-10-CM | POA: Diagnosis not present

## 2020-01-27 DIAGNOSIS — R1314 Dysphagia, pharyngoesophageal phase: Secondary | ICD-10-CM | POA: Diagnosis not present

## 2020-01-27 DIAGNOSIS — R400 Somnolence: Secondary | ICD-10-CM | POA: Diagnosis not present

## 2020-01-27 DIAGNOSIS — I69322 Dysarthria following cerebral infarction: Secondary | ICD-10-CM | POA: Diagnosis not present

## 2020-01-27 DIAGNOSIS — K2100 Gastro-esophageal reflux disease with esophagitis, without bleeding: Secondary | ICD-10-CM | POA: Diagnosis not present

## 2020-01-27 DIAGNOSIS — D72829 Elevated white blood cell count, unspecified: Secondary | ICD-10-CM | POA: Diagnosis not present

## 2020-01-28 DIAGNOSIS — D72829 Elevated white blood cell count, unspecified: Secondary | ICD-10-CM | POA: Diagnosis not present

## 2020-01-28 DIAGNOSIS — I69322 Dysarthria following cerebral infarction: Secondary | ICD-10-CM | POA: Diagnosis not present

## 2020-01-28 DIAGNOSIS — R1314 Dysphagia, pharyngoesophageal phase: Secondary | ICD-10-CM | POA: Diagnosis not present

## 2020-01-28 DIAGNOSIS — R400 Somnolence: Secondary | ICD-10-CM | POA: Diagnosis not present

## 2020-01-28 DIAGNOSIS — K2100 Gastro-esophageal reflux disease with esophagitis, without bleeding: Secondary | ICD-10-CM | POA: Diagnosis not present

## 2020-01-28 DIAGNOSIS — Z79899 Other long term (current) drug therapy: Secondary | ICD-10-CM | POA: Diagnosis not present

## 2020-01-29 DIAGNOSIS — I69322 Dysarthria following cerebral infarction: Secondary | ICD-10-CM | POA: Diagnosis not present

## 2020-01-29 DIAGNOSIS — D72829 Elevated white blood cell count, unspecified: Secondary | ICD-10-CM | POA: Diagnosis not present

## 2020-01-29 DIAGNOSIS — W19XXXA Unspecified fall, initial encounter: Secondary | ICD-10-CM | POA: Diagnosis not present

## 2020-01-29 DIAGNOSIS — R400 Somnolence: Secondary | ICD-10-CM | POA: Diagnosis not present

## 2020-01-29 DIAGNOSIS — K2100 Gastro-esophageal reflux disease with esophagitis, without bleeding: Secondary | ICD-10-CM | POA: Diagnosis not present

## 2020-01-29 DIAGNOSIS — M25552 Pain in left hip: Secondary | ICD-10-CM | POA: Diagnosis not present

## 2020-01-29 DIAGNOSIS — R1314 Dysphagia, pharyngoesophageal phase: Secondary | ICD-10-CM | POA: Diagnosis not present

## 2020-02-02 DIAGNOSIS — W19XXXA Unspecified fall, initial encounter: Secondary | ICD-10-CM | POA: Diagnosis not present

## 2020-02-02 DIAGNOSIS — M25551 Pain in right hip: Secondary | ICD-10-CM | POA: Diagnosis not present

## 2020-02-05 DIAGNOSIS — Z79899 Other long term (current) drug therapy: Secondary | ICD-10-CM | POA: Diagnosis not present

## 2020-02-09 DIAGNOSIS — M79642 Pain in left hand: Secondary | ICD-10-CM | POA: Diagnosis not present

## 2020-02-15 DIAGNOSIS — I63039 Cerebral infarction due to thrombosis of unspecified carotid artery: Secondary | ICD-10-CM | POA: Diagnosis not present

## 2020-02-15 DIAGNOSIS — I69322 Dysarthria following cerebral infarction: Secondary | ICD-10-CM | POA: Diagnosis not present

## 2020-02-15 DIAGNOSIS — I6340 Cerebral infarction due to embolism of unspecified cerebral artery: Secondary | ICD-10-CM | POA: Diagnosis not present

## 2020-02-15 DIAGNOSIS — K2100 Gastro-esophageal reflux disease with esophagitis, without bleeding: Secondary | ICD-10-CM | POA: Diagnosis not present

## 2020-02-29 DIAGNOSIS — I63039 Cerebral infarction due to thrombosis of unspecified carotid artery: Secondary | ICD-10-CM | POA: Diagnosis not present

## 2020-03-15 DIAGNOSIS — I63039 Cerebral infarction due to thrombosis of unspecified carotid artery: Secondary | ICD-10-CM | POA: Diagnosis not present

## 2020-03-17 DIAGNOSIS — N1832 Chronic kidney disease, stage 3b: Secondary | ICD-10-CM | POA: Diagnosis not present

## 2020-03-17 DIAGNOSIS — I482 Chronic atrial fibrillation, unspecified: Secondary | ICD-10-CM | POA: Diagnosis not present

## 2020-03-17 DIAGNOSIS — K2100 Gastro-esophageal reflux disease with esophagitis, without bleeding: Secondary | ICD-10-CM | POA: Diagnosis not present

## 2020-03-17 DIAGNOSIS — Z0001 Encounter for general adult medical examination with abnormal findings: Secondary | ICD-10-CM | POA: Diagnosis not present

## 2020-03-21 DIAGNOSIS — I739 Peripheral vascular disease, unspecified: Secondary | ICD-10-CM | POA: Diagnosis not present

## 2020-03-21 DIAGNOSIS — R262 Difficulty in walking, not elsewhere classified: Secondary | ICD-10-CM | POA: Diagnosis not present

## 2020-03-21 DIAGNOSIS — B351 Tinea unguium: Secondary | ICD-10-CM | POA: Diagnosis not present

## 2020-03-21 DIAGNOSIS — M2041 Other hammer toe(s) (acquired), right foot: Secondary | ICD-10-CM | POA: Diagnosis not present

## 2020-03-21 DIAGNOSIS — M2042 Other hammer toe(s) (acquired), left foot: Secondary | ICD-10-CM | POA: Diagnosis not present

## 2020-03-28 DIAGNOSIS — I63039 Cerebral infarction due to thrombosis of unspecified carotid artery: Secondary | ICD-10-CM | POA: Diagnosis not present

## 2020-03-29 DIAGNOSIS — R0989 Other specified symptoms and signs involving the circulatory and respiratory systems: Secondary | ICD-10-CM | POA: Diagnosis not present

## 2020-03-29 DIAGNOSIS — I482 Chronic atrial fibrillation, unspecified: Secondary | ICD-10-CM | POA: Diagnosis not present

## 2020-03-29 DIAGNOSIS — N1832 Chronic kidney disease, stage 3b: Secondary | ICD-10-CM | POA: Diagnosis not present

## 2020-03-29 DIAGNOSIS — I87303 Chronic venous hypertension (idiopathic) without complications of bilateral lower extremity: Secondary | ICD-10-CM | POA: Diagnosis not present

## 2020-03-30 DIAGNOSIS — I5020 Unspecified systolic (congestive) heart failure: Secondary | ICD-10-CM | POA: Diagnosis not present

## 2020-03-30 DIAGNOSIS — R7989 Other specified abnormal findings of blood chemistry: Secondary | ICD-10-CM | POA: Diagnosis not present

## 2020-03-30 DIAGNOSIS — I10 Essential (primary) hypertension: Secondary | ICD-10-CM | POA: Diagnosis not present

## 2020-04-11 DIAGNOSIS — I63039 Cerebral infarction due to thrombosis of unspecified carotid artery: Secondary | ICD-10-CM | POA: Diagnosis not present

## 2020-04-26 DIAGNOSIS — I63039 Cerebral infarction due to thrombosis of unspecified carotid artery: Secondary | ICD-10-CM | POA: Diagnosis not present

## 2020-04-26 DIAGNOSIS — I482 Chronic atrial fibrillation, unspecified: Secondary | ICD-10-CM | POA: Diagnosis not present

## 2020-04-26 DIAGNOSIS — N1832 Chronic kidney disease, stage 3b: Secondary | ICD-10-CM | POA: Diagnosis not present

## 2020-04-26 DIAGNOSIS — K2100 Gastro-esophageal reflux disease with esophagitis, without bleeding: Secondary | ICD-10-CM | POA: Diagnosis not present

## 2020-04-28 DIAGNOSIS — I482 Chronic atrial fibrillation, unspecified: Secondary | ICD-10-CM | POA: Diagnosis not present

## 2020-04-28 DIAGNOSIS — L600 Ingrowing nail: Secondary | ICD-10-CM | POA: Diagnosis not present

## 2020-04-28 DIAGNOSIS — K2100 Gastro-esophageal reflux disease with esophagitis, without bleeding: Secondary | ICD-10-CM | POA: Diagnosis not present

## 2020-04-29 DIAGNOSIS — R601 Generalized edema: Secondary | ICD-10-CM | POA: Diagnosis not present

## 2020-05-02 DIAGNOSIS — M13872 Other specified arthritis, left ankle and foot: Secondary | ICD-10-CM | POA: Diagnosis not present

## 2020-05-02 DIAGNOSIS — I482 Chronic atrial fibrillation, unspecified: Secondary | ICD-10-CM | POA: Diagnosis not present

## 2020-05-02 DIAGNOSIS — N1832 Chronic kidney disease, stage 3b: Secondary | ICD-10-CM | POA: Diagnosis not present

## 2020-05-09 DIAGNOSIS — I63039 Cerebral infarction due to thrombosis of unspecified carotid artery: Secondary | ICD-10-CM | POA: Diagnosis not present

## 2020-05-23 DIAGNOSIS — I63039 Cerebral infarction due to thrombosis of unspecified carotid artery: Secondary | ICD-10-CM | POA: Diagnosis not present

## 2020-06-06 DIAGNOSIS — I63039 Cerebral infarction due to thrombosis of unspecified carotid artery: Secondary | ICD-10-CM | POA: Diagnosis not present

## 2020-06-17 DIAGNOSIS — Z79899 Other long term (current) drug therapy: Secondary | ICD-10-CM | POA: Diagnosis not present

## 2020-06-17 DIAGNOSIS — I10 Essential (primary) hypertension: Secondary | ICD-10-CM | POA: Diagnosis not present

## 2020-06-20 DIAGNOSIS — I63039 Cerebral infarction due to thrombosis of unspecified carotid artery: Secondary | ICD-10-CM | POA: Diagnosis not present

## 2020-07-04 DIAGNOSIS — I63039 Cerebral infarction due to thrombosis of unspecified carotid artery: Secondary | ICD-10-CM | POA: Diagnosis not present

## 2020-07-18 DIAGNOSIS — I63039 Cerebral infarction due to thrombosis of unspecified carotid artery: Secondary | ICD-10-CM | POA: Diagnosis not present

## 2020-07-18 DIAGNOSIS — I10 Essential (primary) hypertension: Secondary | ICD-10-CM | POA: Diagnosis not present

## 2020-07-26 DIAGNOSIS — L602 Onychogryphosis: Secondary | ICD-10-CM | POA: Diagnosis not present

## 2020-07-26 DIAGNOSIS — I739 Peripheral vascular disease, unspecified: Secondary | ICD-10-CM | POA: Diagnosis not present

## 2020-08-01 DIAGNOSIS — I63039 Cerebral infarction due to thrombosis of unspecified carotid artery: Secondary | ICD-10-CM | POA: Diagnosis not present

## 2020-08-13 DIAGNOSIS — Z79899 Other long term (current) drug therapy: Secondary | ICD-10-CM | POA: Diagnosis not present

## 2020-08-18 DIAGNOSIS — I63039 Cerebral infarction due to thrombosis of unspecified carotid artery: Secondary | ICD-10-CM | POA: Diagnosis not present

## 2020-08-23 DIAGNOSIS — N1832 Chronic kidney disease, stage 3b: Secondary | ICD-10-CM | POA: Diagnosis not present

## 2020-08-23 DIAGNOSIS — G8192 Hemiplegia, unspecified affecting left dominant side: Secondary | ICD-10-CM | POA: Diagnosis not present

## 2020-08-23 DIAGNOSIS — R1314 Dysphagia, pharyngoesophageal phase: Secondary | ICD-10-CM | POA: Diagnosis not present

## 2020-08-23 DIAGNOSIS — I482 Chronic atrial fibrillation, unspecified: Secondary | ICD-10-CM | POA: Diagnosis not present

## 2020-08-23 DIAGNOSIS — I69322 Dysarthria following cerebral infarction: Secondary | ICD-10-CM | POA: Diagnosis not present

## 2020-08-23 DIAGNOSIS — K2100 Gastro-esophageal reflux disease with esophagitis, without bleeding: Secondary | ICD-10-CM | POA: Diagnosis not present

## 2020-08-29 DIAGNOSIS — I63039 Cerebral infarction due to thrombosis of unspecified carotid artery: Secondary | ICD-10-CM | POA: Diagnosis not present

## 2020-09-06 DIAGNOSIS — G4700 Insomnia, unspecified: Secondary | ICD-10-CM | POA: Diagnosis not present

## 2020-09-12 DIAGNOSIS — I63039 Cerebral infarction due to thrombosis of unspecified carotid artery: Secondary | ICD-10-CM | POA: Diagnosis not present

## 2020-09-16 DIAGNOSIS — N390 Urinary tract infection, site not specified: Secondary | ICD-10-CM | POA: Diagnosis not present

## 2020-09-20 DIAGNOSIS — G4700 Insomnia, unspecified: Secondary | ICD-10-CM | POA: Diagnosis not present

## 2020-09-23 DIAGNOSIS — A084 Viral intestinal infection, unspecified: Secondary | ICD-10-CM | POA: Diagnosis not present

## 2020-09-26 DIAGNOSIS — I63039 Cerebral infarction due to thrombosis of unspecified carotid artery: Secondary | ICD-10-CM | POA: Diagnosis not present

## 2020-10-06 DIAGNOSIS — M25512 Pain in left shoulder: Secondary | ICD-10-CM | POA: Diagnosis not present

## 2020-10-10 DIAGNOSIS — I63039 Cerebral infarction due to thrombosis of unspecified carotid artery: Secondary | ICD-10-CM | POA: Diagnosis not present

## 2020-10-24 DIAGNOSIS — I6340 Cerebral infarction due to embolism of unspecified cerebral artery: Secondary | ICD-10-CM | POA: Diagnosis not present

## 2020-10-24 DIAGNOSIS — I482 Chronic atrial fibrillation, unspecified: Secondary | ICD-10-CM | POA: Diagnosis not present

## 2020-10-24 DIAGNOSIS — I63039 Cerebral infarction due to thrombosis of unspecified carotid artery: Secondary | ICD-10-CM | POA: Diagnosis not present

## 2020-10-24 DIAGNOSIS — N1832 Chronic kidney disease, stage 3b: Secondary | ICD-10-CM | POA: Diagnosis not present

## 2020-10-24 DIAGNOSIS — R1314 Dysphagia, pharyngoesophageal phase: Secondary | ICD-10-CM | POA: Diagnosis not present

## 2020-10-24 DIAGNOSIS — G8192 Hemiplegia, unspecified affecting left dominant side: Secondary | ICD-10-CM | POA: Diagnosis not present

## 2020-10-24 DIAGNOSIS — I69322 Dysarthria following cerebral infarction: Secondary | ICD-10-CM | POA: Diagnosis not present

## 2020-10-25 DIAGNOSIS — G4700 Insomnia, unspecified: Secondary | ICD-10-CM | POA: Diagnosis not present

## 2020-10-25 DIAGNOSIS — I739 Peripheral vascular disease, unspecified: Secondary | ICD-10-CM | POA: Diagnosis not present

## 2020-10-25 DIAGNOSIS — L602 Onychogryphosis: Secondary | ICD-10-CM | POA: Diagnosis not present

## 2020-11-03 DIAGNOSIS — I6340 Cerebral infarction due to embolism of unspecified cerebral artery: Secondary | ICD-10-CM | POA: Diagnosis not present

## 2020-11-03 DIAGNOSIS — N1832 Chronic kidney disease, stage 3b: Secondary | ICD-10-CM | POA: Diagnosis not present

## 2020-11-03 DIAGNOSIS — G8192 Hemiplegia, unspecified affecting left dominant side: Secondary | ICD-10-CM | POA: Diagnosis not present

## 2020-11-03 DIAGNOSIS — I69322 Dysarthria following cerebral infarction: Secondary | ICD-10-CM | POA: Diagnosis not present

## 2020-11-03 DIAGNOSIS — R1314 Dysphagia, pharyngoesophageal phase: Secondary | ICD-10-CM | POA: Diagnosis not present

## 2020-11-03 DIAGNOSIS — I482 Chronic atrial fibrillation, unspecified: Secondary | ICD-10-CM | POA: Diagnosis not present

## 2020-11-07 DIAGNOSIS — I63039 Cerebral infarction due to thrombosis of unspecified carotid artery: Secondary | ICD-10-CM | POA: Diagnosis not present

## 2020-11-10 DIAGNOSIS — Z79899 Other long term (current) drug therapy: Secondary | ICD-10-CM | POA: Diagnosis not present

## 2020-11-10 DIAGNOSIS — E559 Vitamin D deficiency, unspecified: Secondary | ICD-10-CM | POA: Diagnosis not present

## 2020-11-24 DIAGNOSIS — Z79899 Other long term (current) drug therapy: Secondary | ICD-10-CM | POA: Diagnosis not present

## 2020-12-02 DIAGNOSIS — G8192 Hemiplegia, unspecified affecting left dominant side: Secondary | ICD-10-CM | POA: Diagnosis not present

## 2020-12-02 DIAGNOSIS — I482 Chronic atrial fibrillation, unspecified: Secondary | ICD-10-CM | POA: Diagnosis not present

## 2020-12-02 DIAGNOSIS — N1832 Chronic kidney disease, stage 3b: Secondary | ICD-10-CM | POA: Diagnosis not present

## 2020-12-02 DIAGNOSIS — I6340 Cerebral infarction due to embolism of unspecified cerebral artery: Secondary | ICD-10-CM | POA: Diagnosis not present

## 2020-12-02 DIAGNOSIS — I69322 Dysarthria following cerebral infarction: Secondary | ICD-10-CM | POA: Diagnosis not present

## 2020-12-02 DIAGNOSIS — R1314 Dysphagia, pharyngoesophageal phase: Secondary | ICD-10-CM | POA: Diagnosis not present

## 2020-12-08 DIAGNOSIS — Z79899 Other long term (current) drug therapy: Secondary | ICD-10-CM | POA: Diagnosis not present

## 2020-12-20 DIAGNOSIS — G4700 Insomnia, unspecified: Secondary | ICD-10-CM | POA: Diagnosis not present

## 2020-12-22 DIAGNOSIS — Z79899 Other long term (current) drug therapy: Secondary | ICD-10-CM | POA: Diagnosis not present

## 2021-01-02 DIAGNOSIS — Z79899 Other long term (current) drug therapy: Secondary | ICD-10-CM | POA: Diagnosis not present

## 2021-01-05 DIAGNOSIS — Z79899 Other long term (current) drug therapy: Secondary | ICD-10-CM | POA: Diagnosis not present

## 2021-01-17 DIAGNOSIS — E559 Vitamin D deficiency, unspecified: Secondary | ICD-10-CM | POA: Diagnosis not present

## 2021-01-17 DIAGNOSIS — G4700 Insomnia, unspecified: Secondary | ICD-10-CM | POA: Diagnosis not present

## 2021-01-17 DIAGNOSIS — Z79899 Other long term (current) drug therapy: Secondary | ICD-10-CM | POA: Diagnosis not present

## 2021-01-19 DIAGNOSIS — Z79899 Other long term (current) drug therapy: Secondary | ICD-10-CM | POA: Diagnosis not present

## 2021-01-19 DIAGNOSIS — M70979 Unspecified soft tissue disorder related to use, overuse and pressure, unspecified ankle and foot: Secondary | ICD-10-CM | POA: Diagnosis not present

## 2021-01-20 DIAGNOSIS — I6340 Cerebral infarction due to embolism of unspecified cerebral artery: Secondary | ICD-10-CM | POA: Diagnosis not present

## 2021-01-20 DIAGNOSIS — I69322 Dysarthria following cerebral infarction: Secondary | ICD-10-CM | POA: Diagnosis not present

## 2021-01-20 DIAGNOSIS — I482 Chronic atrial fibrillation, unspecified: Secondary | ICD-10-CM | POA: Diagnosis not present

## 2021-01-20 DIAGNOSIS — G8192 Hemiplegia, unspecified affecting left dominant side: Secondary | ICD-10-CM | POA: Diagnosis not present

## 2021-01-20 DIAGNOSIS — N1832 Chronic kidney disease, stage 3b: Secondary | ICD-10-CM | POA: Diagnosis not present

## 2021-01-20 DIAGNOSIS — R1314 Dysphagia, pharyngoesophageal phase: Secondary | ICD-10-CM | POA: Diagnosis not present

## 2021-01-26 DIAGNOSIS — M79609 Pain in unspecified limb: Secondary | ICD-10-CM | POA: Diagnosis not present

## 2021-01-31 DIAGNOSIS — I10 Essential (primary) hypertension: Secondary | ICD-10-CM | POA: Diagnosis not present

## 2021-01-31 DIAGNOSIS — I509 Heart failure, unspecified: Secondary | ICD-10-CM | POA: Diagnosis not present

## 2021-02-02 DIAGNOSIS — N390 Urinary tract infection, site not specified: Secondary | ICD-10-CM | POA: Diagnosis not present

## 2021-02-02 DIAGNOSIS — M70979 Unspecified soft tissue disorder related to use, overuse and pressure, unspecified ankle and foot: Secondary | ICD-10-CM | POA: Diagnosis not present

## 2021-02-02 DIAGNOSIS — Z79899 Other long term (current) drug therapy: Secondary | ICD-10-CM | POA: Diagnosis not present

## 2021-02-07 DIAGNOSIS — I739 Peripheral vascular disease, unspecified: Secondary | ICD-10-CM | POA: Diagnosis not present

## 2021-02-07 DIAGNOSIS — L602 Onychogryphosis: Secondary | ICD-10-CM | POA: Diagnosis not present

## 2021-02-09 DIAGNOSIS — M70979 Unspecified soft tissue disorder related to use, overuse and pressure, unspecified ankle and foot: Secondary | ICD-10-CM | POA: Diagnosis not present

## 2021-02-14 DIAGNOSIS — G4700 Insomnia, unspecified: Secondary | ICD-10-CM | POA: Diagnosis not present

## 2021-02-16 DIAGNOSIS — K219 Gastro-esophageal reflux disease without esophagitis: Secondary | ICD-10-CM | POA: Diagnosis not present

## 2021-02-16 DIAGNOSIS — Z79899 Other long term (current) drug therapy: Secondary | ICD-10-CM | POA: Diagnosis not present

## 2021-02-16 DIAGNOSIS — M70979 Unspecified soft tissue disorder related to use, overuse and pressure, unspecified ankle and foot: Secondary | ICD-10-CM | POA: Diagnosis not present

## 2022-07-02 NOTE — ED Provider Notes (Signed)
 Emergency Department Provider Note   Documentation assistance was provided by [facility] Scribe, on 1:40 PM 07/02/2022 for Dorn Hoover, M.D.   Provider at bedside: 1:40 PM  History obtained from the: Patient and EMS/Paramedics  History   Chief Complaint  Patient presents with  . Cerebrovascular Accident     HPI  Johnny Zimmerman is a 88 y.o. male who presents to the ED via [facility] with complaints of stroke-like symptoms. EMS states nursing home staff reported slurred speech and facial droop 30 minutes prior to their arrival. On arrival, EMS did not notice any slurred speech or facial droop. At 12:45 PM en route to the ED, EMS reports left-sided facial droop and garbled speech. Patient reports fall yesterday, states he hit his head. He reports some right upper quadrant abdominal pain. Patient denies any other complaints.   No LMP for male patient.   Past Medical History Past Medical History:  Diagnosis Date  . BPH (benign prostatic hyperplasia) 01/06/2015  . Cerebrovascular disease 06/05/2021  . Essential hypertension 01/06/2015  . Peripheral vascular disease (HCC) 01/06/2015    Past Surgical History History reviewed. No pertinent surgical history.   Medications Home Medications  Medication Sig Dispense Refill  . apixaban (ELIQUIS) 5 mg tablet *ANTICOAGULANT* Take 1 tablet (5 mg total) by mouth 2 times daily. Start date09/28/2023:       Allergies Allergies  Allergen Reactions  . Codeine Other (See Comments)    unknown     Family History History reviewed. No pertinent family history.   Social History     Review of Systems  Review of Systems  Constitutional: Negative for chills, diaphoresis and fever.  HENT: Negative for congestion, rhinorrhea, sneezing and sore throat.   Eyes: Negative for visual disturbance.  Respiratory: Negative for cough, choking, chest tightness, shortness of breath, wheezing and stridor.   Cardiovascular: Negative for chest pain,  palpitations and leg swelling.  Gastrointestinal: Positive for abdominal pain. Negative for blood in stool, constipation, diarrhea, nausea and vomiting.  Endocrine: Negative for polydipsia, polyphagia and polyuria.  Genitourinary: Negative for decreased urine volume, difficulty urinating, dysuria, flank pain, frequency and hematuria.  Musculoskeletal: Negative for back pain, myalgias, neck pain and neck stiffness.  Skin: Negative for color change, rash and wound.  Neurological: Positive for facial asymmetry and speech difficulty. Negative for dizziness, seizures, syncope, weakness, light-headedness, numbness and headaches.  Psychiatric/Behavioral: Negative.   All other systems reviewed and are negative.    Physical Exam   Vitals:   07/02/22 1333 07/02/22 1430  BP: 145/100 120/72  Pulse: 87 90  Temp: 97.6 F (36.4 C)   Resp: 18   SpO2: 96% 95%  PainSc: 0-Zero     Physical Exam Vitals and nursing note reviewed.  Constitutional:      General: He is awake. He is not in acute distress.    Appearance: He is well-developed.  HENT:     Head: Normocephalic and atraumatic.     Right Ear: External ear normal.     Left Ear: External ear normal.     Nose: Nose normal.     Mouth/Throat:     Pharynx: No oropharyngeal exudate.  Eyes:     General: No scleral icterus.    Conjunctiva/sclera: Conjunctivae normal.     Pupils: Pupils are equal, round, and reactive to light.  Neck:     Trachea: No tracheal deviation.  Cardiovascular:     Rate and Rhythm: Normal rate and regular rhythm.     Heart  sounds: Normal heart sounds. No murmur heard.    No friction rub. No gallop.  Pulmonary:     Effort: Pulmonary effort is normal. No respiratory distress.     Breath sounds: Normal breath sounds. No stridor. No wheezing or rales.  Abdominal:     General: Bowel sounds are normal. There is no distension.     Palpations: Abdomen is soft. There is no mass.     Tenderness: There is no abdominal  tenderness.     Hernia: No hernia is present.  Musculoskeletal:        General: Normal range of motion.     Cervical back: Normal range of motion and neck supple.  Skin:    General: Skin is warm and dry.     Capillary Refill: Capillary refill takes less than 2 seconds.     Coloration: Skin is not cyanotic.     Nails: There is no clubbing.  Neurological:     Mental Status: He is alert and oriented to person, place, and time.     Cranial Nerves: Dysarthria present. No cranial nerve deficit.     Comments: Dysarthric speech.  Psychiatric:        Behavior: Behavior normal.        Thought Content: Thought content normal.        Judgment: Judgment normal.     Previous Chart Review   Reviewed [facility] admission on 12/21/2019 for CVA and acute ischemic stroke.  Differential Diagnosis   TIA, CVA, metabolic abnormality, electrolyte abnormality, occult infection, head injury.  Cardiac Monitor Interpretation   Sinus 87, 96% sats.  EKG Interpretation and HEAR Score   EKG time: 1337 Interpretation time: 4:40 PM Rate: 87 Rhythm: Atrial Fibrillation Axis: left Intervals: RBBB ST-T Waves: Normal ST-T Waves Comparison with Old: No    (A HEAR Score of 0-3 is Low, a Score of 4-8 is High)    Initial Radiology Interpretation & Consideration   My initial interpretation the patient's head CT: Volume loss appropriate age, no other acute findings.  Radiology reads of all radiologic studies have been reviewed.  Interventions   Patient given p.o. potassium   Lab Interpretation   CBC shows hemoglobin 10.5, hematocrit 30.8, otherwise normal.  BMP shows potassium 3.2, glucose 126, calcium 7.9  Procedure Note  Procedures   Consults   4:40 PM Case discussed with Telestroke who will admit for normal stroke workup.  4:40 PM Case discussed with gen med for admission.  MDM   ED Course as of 07/02/22 1640  Mon Jul 02, 2022  1638 Patient presented today with possible TIA symptoms  including difficulty with speech, left-sided facial droop, left-sided weakness.  The symptoms of at all now resolved.  His lab work-up is significant for hypokalemia and mild anemia but otherwise normal.  Imaging studies were all negative for acute process.  The patient was not a TNK candidate.  These findings were all discussed with the patient's son and the patient and the patient will be admitted for TIA work-up. [JW]    ED Course User Index [JW] Carlin Dorn Lesches, MD     Clinical Complexity  Patient's presentation is most consistent with acute presentation with potential threat to life or bodily function. .   Provider time spent in patient care today, inclusive of but not limited to clinical reassessment, review of diagnostic studies, and discharge preparation, was greater than 30 minutes.   ED Clinical Impression   1. Slurred speech   2. TIA (transient  ischemic attack)     ED Disposition   Patient will be admitted for further evaluation and treatment.   _______________________________________________________________________ ____________________________ Scribe's Attestation: This document serves as a record of services personally performed by Dorn Hoover, MD. It was created on their behalf by Sherryle Hamilton, Medical Scribe, a trained medical scribe. The creation of this record is the provider's dictation and/or activities during the visit.   Electronically signed by: Sherryle Hamilton, Medical Scribe 07/02/2022 1:43 PM    Electronically signed by: Hoover Dorn Lesches, MD 07/02/22 1640

## 2022-07-02 NOTE — H&P (Signed)
 ------------------------------------------------------------------------------- Attestation signed by Satira Emery RAMAN, MD at 07/03/2022  5:32 PM I have discussed the assessment and plan with the resident, I agree with the plan.  -------------------------------------------------------------------------------  General Medicine [place] [facility] History and Physical   SUBJECTIVE: Chief Complaint: slurred speech History obtained from: patient, family, records History of Present Illness: Johnny Zimmerman is a 88 y.o. male with PMH of atrial fibrillation on eliquis, CVA, dementia, CKD III, HTN who presents after multiple falls and slurred speech today concerning for TIA  Patient evaluated with son at bedside, daughter on phone. Patient presents from SNF reportedly for facial droop and slurred speech today, in addition to multiple recent falls, unclear if syncopal episodes as patient denied losing consciousness. Slurred speech was noted by EMS as well and code stroke called in ED as well, no infarcts noted on CT head, and symptoms had resolved by my evaluation. Patient otherwise denied headache, vision changes, chest pain, shortness of breath, any new weakness, did point to his ribs bilaterally stating they were a little sore to touch- maybe secondary to recent falls. At baseline uses a wheelchair/rolling walker to get around.   ED Course (reviewed and summarized):   Vitals: T 97  P 87  RR 18  BP 145/100 Labs:  Hgb 10.5  K 3.2  Cr 1.45 (unknown baseline) EKG: atrial fibrillation, right bundle branch block Radiology:  CTA head neck/perfusion:  1. No acute intracranial process. No infarct core or penumbra on CT perfusion. 2. No intracranial large vessel occlusion. Severe stenosis in the right cavernous ICA, right V4, and left P2. Moderate stenosis in the distal left petrous ICA, left ICA terminus, and proximal right M1. 3. Approximately 60% stenosis in the bilateral proximal ICAs. 4. Moderate  narrowing at the origin of the left vertebral artery. Moderate to severe narrowing in the distal V3 segment and mild-to-moderate narrowing in the right V2 segment of the right vertebral artery. 5. 85% stenosis in the proximal right subclavian artery and 50% stenosis at the origin of the left common carotid artery. 6. Aortic atherosclerosis.  Interventions: Code stroke was called, telestroke nurse determined that since patient had last been on eliquis, symptoms since resolved, low NIHSS, tnk contraindicated.  Past Medical History:  Diagnosis Date  . BPH (benign prostatic hyperplasia) 01/06/2015  . Cerebrovascular disease 06/05/2021  . Essential hypertension 01/06/2015  . Peripheral vascular disease (HCC) 01/06/2015    Social History   Socioeconomic History  . Marital status: Widowed    Spouse name: Not on file  . Number of children: Not on file  . Years of education: Not on file  . Highest education level: Not on file  Occupational History  . Not on file  Tobacco Use  . Smoking status: Not on file  . Smokeless tobacco: Not on file  Substance and Sexual Activity  . Alcohol use: Not on file  . Drug use: Not on file  . Sexual activity: Not on file  Other Topics Concern  . Not on file  Social History Narrative  . Not on file   Social Determinants of Health   Food Insecurity: Not on file  Transportation Needs: Not on file  Living Situation: Not on file   History reviewed. No pertinent family history.  Pertinent items are noted in HPI.   OBJECTIVE:  Physical Exam:  Vital Signs:   Vitals:   07/02/22 1333 07/02/22 1430  BP: 145/100 120/72  Pulse: 87 90  Temp: 97.6 F (36.4 C)   Resp:  18   SpO2: 96% 95%  PainSc: 0-Zero   Weight: 68 kg (150 lb)     CONSTITUTIONAL: well developed, nourished, no distress CARDIOVASCULAR: heart sounds normal, irregular rate PULMONARY/CHEST WALL: breath sounds normal and effort normal ABDOMINAL: soft, non distended, not tender to  palpation  NEUROLOGICAL: CN II-XII intact, no focal deficits, strength 5/5 in upper and lower extremities, no sensory deficit MUSCULOSKELETAL: normal ROM, no edema SKIN: dry, intact and warm  PSYCH: affect normal and judgement normal  CBC:  Recent Labs  Lab 07/02/22 1512  WBC 9.1  HGB 10.5*  HCT 30.8*  PLT 130*   and CMP:   Recent Labs  Lab 07/02/22 1512 07/03/22 0146  NA 140 140  K 3.2* 3.9  CL 99 102  CO2 31* 32*  BUN 15 14  CREATININE 1.45 1.39  GLU 126* 122*  CALCIUM 7.9* 7.6*  MG  --  0.9*  BILITOT 0.6  --   AST 18  --   ALT 14  --   ALKPHOS 68  --   PROT 6.7  --   ALBUMIN 3.4*  --   ANIONGAP 10 6    ASSESSMENT/PLAN: [name] is a 88 y.o. male with PMH of atrial fibrillation on eliquis, CVA, dementia, CKD III, HTN who presents after multiple falls and slurred speech today concerning for TIA   #TIA  #Hx of CVA #Recent Falls Patient presenting from SNF with concern of slurred speech that was noted via EMS, however had since resolved on re-evaluation. Code Stroke initiated with CT head showing no acute infarct, severe stenosis of right ICA, 60% stenosis of bilateral proximal ICAs, 85% stenosis of proximal right subclavian artery. EKG showing rate-controlled afib. No significant lab abnormalities.  Given low NIHHS score and eliquis taken this am, tpa/tnk was contraindicated. Patient showed no other neurologic deficits. Unclear whether falls at facility are due to weakness/imbalance or syncope.  Previous TTE showing mild aortic stenosis, no obvious murmur on exam.  PLAN: -q4hr neurochecks -MR head -TTE -PT/OT -neuro consult pending MR head results - continue statin, eliquis for afib  #Afib EKG notable for rate controlled a-fib.  PLAN:  -continue eliquis -continue home coreg    Hospital Checklist DVT Prophylaxis is by    None     #FEN: No diet orders on file #Discharge Planning: Discharge back to facility likely tomorrow #Ethics:No  Order     Electronically signed by: Jorene Belynda Carnes, MD 07/02/2022 4:35 PM    Electronically signed by: Carnes Jorene Belynda, MD Resident 07/03/22 [phone]    Electronically signed by: Satira Emery RAMAN, MD 07/03/22 1732

## 2022-07-03 NOTE — Discharge Summary (Signed)
 ------------------------------------------------------------------------------- Attestation signed by Cherilyn [place] Radish, MD at 07/03/2022  3:19 PM I have discussed the discharge plan and recommendations with the resident I examined the patient and reviewed pertinent labs and imaging, and agree with the documented discharge summary -------------------------------------------------------------------------------  [facility] [place] [facility] Discharge Summary  Name: Johnny Zimmerman MRN: [id number] Age: 88 yrs DOB: May 06, 1924  Admit date: 07/02/2022 Discharge date and time: No discharge date for patient encounter.   Admitting Physician: Emery GORMAN Barrs, MD Discharge Physician: CHERILYN ARGENTINA  Admission Diagnoses:  TIA (transient ischemic attack) [G45.9] Slurred speech [R47.81]   Discharge Diagnoses:  #TIA  #Hx of CVA #Recent Falls  Admission Condition: fair Discharged Condition: good  Hospital Course:  For full details, please see H&P, progress notes, consult notes and ancillary notes.   Briefly, Johnny Zimmerman is a 88 y.o. male with PMH of atrial fibrillation on eliquis, CVA, dementia, CKD III, HTN who presents after multiple falls and slurred speech today concerning for TIA  The patient's hospital course will be summarized in a problem based approach below.    #TIA  #Hx of CVA #Recent Falls Patient presenting from SNF with concern of slurred speech that was noted via EMS, however had since resolved on re-evaluation. Code Stroke initiated with CT head showing no acute infarct, severe stenosis of right ICA, 60% stenosis of bilateral proximal ICAs, 85% stenosis of proximal right subclavian artery. Labs without any significant abnormalities, EKG showing rate-controlled afib.Given low NIHHS score and eliquis taken this am, tpa/tnk was contraindicated. Patient showed no other neurologic deficits.MR head revealed no acute stroke but did show previous stroke. [name]    #Hypomagnesemia -Noted on chemistry. repleted IV. Will send with magnesium 400mg  PO BID.   On day of discharge, patient is clinically stable with no new examination findings or acute symptoms compared to prior. The patient was seen by the attending physician on the date of discharge and deemed stable and acceptable for discharge. The patient's chronic medical conditions were treated accordingly per the patient's home medication regimen. The patient's medication reconciliation, follow-up appointments, discharge orders, instructions and significant lab and diagnostic studies are as noted.   General: no acute distress, non-toxic HEENT: very hard of hearing Cardio: RRR. No murmurs/rubs/gallops, No edema of the LE, No JVD, well-perfused Pulmonary: Normal respiratory effort. Clear to ascultation bilaterally. No wheezes, rhonchi, crackles.  Abdomen: Active bowel sounds. Soft, nontender, nondistended.  MSK: no obvious deformities, normal strength Extremities: No clubbing, or cyanosis. Skin: Warm and dry Neuro: AAO x 4. No focal deficits. Psych: Normal mood with congruent affect.    Discharge Follow-up Action Items: PCP Follow up in 1-2 weeks TTE outpatient  PCP to check mag level in a few weeks.    Patient's Ordered Code Status: DNR per Portable DNR  Consults: IP CONSULT TO HOSPITALIST (GENERAL)  Significant Diagnostic Studies:  CBC: Recent Labs  Lab 07/02/22 1512  WBC 9.1  HGB 10.5*  HCT 30.8*  PLT 130*   DIFF: Recent Labs  Lab 07/02/22 1512  NEUTOPHILPCT 72  LYMPHOPCT 16  MONOPCT 12  BASOPCT 0  EOSPCT 1  NEUTROABS 6.5  LYMPHSABS 1.4  MONOSABS 1.1*  BASOSABS 0.0  EOSABS 0.1   CMP: Recent Labs  Lab 07/02/22 1512 07/03/22 0146  NA 140 140  K 3.2* 3.9  CL 99 102  CO2 31* 32*  BUN 15 14  CREATININE 1.45 1.39  GLU 126* 122*  CALCIUM 7.9* 7.6*  MG  --  0.9*  BILITOT 0.6  --  AST 18  --   ALT 14  --   ALKPHOS 68  --   PROT 6.7  --   ALBUMIN 3.4*  --    ANIONGAP 10 6    COAGS: Recent Labs  Lab 07/02/22 1512  LABPROT 17.5*  PTT 39.1*  INR 1.45   CARDIAC:No results for input(s): BNP, TROPONINI, CK, CKMB in the last 168 hours. HEMOGLOBINA1C: Results in Past 30 Days Result Component Current Result Ref Range Previous Result Ref Range  HEMOGLOBIN A1C 7.0 (H) (07/02/2022) <5.7 % Not in Time Range    THYROID : Recent Labs  Lab 07/02/22 1512  TSH 2.07    ECHO: ECHO results from current admission-last 7 days    ** No results found for the last 168 hours. **     IMAGING:  MR BRAIN W & WO CONTRAST  Final Result    CT CODE STROKE (HEAD WO, CTA, PERFUSION)  Final Result      Disposition: Skilled Nursing Facility [facility] Certified)   Patient Instructions:    .    START taking these medications   magnesium oxide 400 mg (241.3 mg magnesium) tablet Commonly known as: MAG-OX Dose: 400 mg Instructions: Take 1 tablet (400 mg total) by mouth 2 times daily.     CONTINUE taking these medications   acetaminophen  650 MG CR tablet Commonly known as: TYLENOL  Dose: 650 mg Instructions: Take 1 tablet (650 mg total) by mouth every 6 (six) hours as needed for Pain.   atorvastatin 40 MG tablet Commonly known as: LIPITOR Dose: 1 tablet Instructions: Take 1 tablet (40 mg total) by mouth nightly.   carvediloL 25 MG tablet Commonly known as: COREG Dose: 1 tablet Instructions: Take 1 tablet (25 mg total) by mouth 2 times daily.   cholecalciferol 1000 UNIT Tab Commonly known as: VITAMIN D3 Dose: 1 tablet Instructions: Take 1 tablet (1,000 Units total) by mouth daily.   diclofenac 1 % Gel gel Commonly known as: VOLTAREN Dose: 1 Application Instructions: Apply 1 Application topically every 6 (six) hours as needed.   docusate sodium 100 MG capsule Commonly known as: COLACE Dose: 100 mg Instructions: Take 1 capsule (100 mg total) by mouth daily.   Eliquis 5 mg tablet *ANTICOAGULANT* Dose: 2.5 mg Instructions: Take 0.5  tablets (2.5 mg total) by mouth 2 times daily. Start date09/28/2023: Generic drug: apixaban   famotidine 20 MG tablet Commonly known as: PEPCID Dose: 20 mg Instructions: 1 tablet (20 mg total) at bedtime.   finasteride 5 mg tablet Commonly known as: PROSCAR Dose: 5 mg Instructions: Take 1 tablet (5 mg total) by mouth daily.   fluticasone  propionate 50 mcg/actuation nasal spray Commonly known as: FLONASE  Dose: 2 spray Instructions: Administer 2 sprays in each nostril daily.   furosemide 40 MG tablet Commonly known as: LASIX Dose: 1 tablet Instructions: Take 1 tablet (40 mg total) by mouth daily.   gabapentin 300 MG capsule Commonly known as: NEURONTIN Dose: 300 mg Instructions: 1 capsule (300 mg total) 2 times daily.   loperamide 2 mg tablet Commonly known as: IMODIUM A-D Dose: 2 mg Instructions: Take 1 tablet (2 mg total) by mouth as needed for Diarrhea. No more than 4 tablets in 24 hours.   loperamide 2 mg tablet Commonly known as: IMODIUM A-D Dose: 4 mg Instructions: Take 2 tablets (4 mg total) by mouth as needed for Diarrhea.   loratadine 10 mg tablet Commonly known as: CLARITIN Dose: 10 mg Instructions: Take 1 tablet (10 mg total) by mouth  daily.   melatonin 3 mg Tab Dose: 3 mg Instructions: Take 1 tablet (3 mg total) by mouth daily.   omeprazole 20 mg Tbec Dose: 1 tablet Instructions: Take 1 tablet (20 mg total) by mouth daily.   potassium chloride ER 10 MEQ extended release tablet Commonly known as: KLOR-CON-M, K-DUR Dose: 10 mEq Instructions: Take 1 tablet (10 mEq total) by mouth daily.   tamsulosin 0.4 mg Cap capsule Commonly known as: FLOMAX Dose: 0.4 mg Instructions: Take 1 capsule (0.4 mg total) by mouth at bedtime.         Discharge Orders and Instructions    Diet At Discharge   Complete by: As directed    Recommended diet at discharge: Regular diet   Activity At Discharge   Complete by: As directed    Recommended activity at  discharge: Activity as tolerated      Follow-up    Contact information for after-discharge care    Destination    [facility] [place] .   Service: Assisted Living Contact information: [address] [place]  [address] [phone]                   Electronically signed by: Colbert Norleen Lunger, MD Resident 07/03/22 1426    Electronically signed by: Colbert Norleen Lunger, MD Resident 07/03/22 1426    Electronically signed by: Colbert Norleen Lunger, MD Resident 07/03/22 1427    Electronically signed by: Cherilyn [place] Radish, MD 07/03/22 [phone]

## 2023-05-31 ENCOUNTER — Emergency Department (HOSPITAL_COMMUNITY)
Admission: EM | Admit: 2023-05-31 | Discharge: 2023-05-31 | Disposition: A | Discharge status: Home / Self Care | Payer: Medicare Other | Attending: Emergency Medicine | Admitting: Emergency Medicine

## 2023-05-31 ENCOUNTER — Other Ambulatory Visit: Payer: Self-pay

## 2023-05-31 ENCOUNTER — Emergency Department (HOSPITAL_COMMUNITY): Payer: Medicare Other

## 2023-05-31 DIAGNOSIS — S0990XA Unspecified injury of head, initial encounter: Secondary | ICD-10-CM | POA: Diagnosis present

## 2023-05-31 DIAGNOSIS — Z79899 Other long term (current) drug therapy: Secondary | ICD-10-CM | POA: Diagnosis not present

## 2023-05-31 DIAGNOSIS — W19XXXA Unspecified fall, initial encounter: Secondary | ICD-10-CM | POA: Diagnosis not present

## 2023-05-31 DIAGNOSIS — I10 Essential (primary) hypertension: Secondary | ICD-10-CM | POA: Diagnosis not present

## 2023-05-31 DIAGNOSIS — Z7982 Long term (current) use of aspirin: Secondary | ICD-10-CM | POA: Diagnosis not present

## 2023-05-31 DIAGNOSIS — T148XXA Other injury of unspecified body region, initial encounter: Secondary | ICD-10-CM

## 2023-05-31 DIAGNOSIS — I2510 Atherosclerotic heart disease of native coronary artery without angina pectoris: Secondary | ICD-10-CM | POA: Insufficient documentation

## 2023-05-31 LAB — CBC
HCT: 35.4 % — ABNORMAL LOW (ref 39.0–52.0)
Hemoglobin: 11.6 g/dL — ABNORMAL LOW (ref 13.0–17.0)
MCH: 34 pg (ref 26.0–34.0)
MCHC: 32.8 g/dL (ref 30.0–36.0)
MCV: 103.8 fL — ABNORMAL HIGH (ref 80.0–100.0)
Platelets: 238 10*3/uL (ref 150–400)
RBC: 3.41 MIL/uL — ABNORMAL LOW (ref 4.22–5.81)
RDW: 14 % (ref 11.5–15.5)
WBC: 6 10*3/uL (ref 4.0–10.5)
nRBC: 0 % (ref 0.0–0.2)

## 2023-05-31 LAB — BASIC METABOLIC PANEL
Anion gap: 9 (ref 5–15)
BUN: 26 mg/dL — ABNORMAL HIGH (ref 8–23)
CO2: 28 mmol/L (ref 22–32)
Calcium: 8.6 mg/dL — ABNORMAL LOW (ref 8.9–10.3)
Chloride: 103 mmol/L (ref 98–111)
Creatinine, Ser: 1.97 mg/dL — ABNORMAL HIGH (ref 0.61–1.24)
GFR, Estimated: 30 mL/min — ABNORMAL LOW (ref >60)
Glucose, Bld: 119 mg/dL — ABNORMAL HIGH (ref 70–99)
Potassium: 3.9 mmol/L (ref 3.5–5.1)
Sodium: 140 mmol/L (ref 135–145)

## 2023-05-31 MED ORDER — ACETAMINOPHEN 500 MG PO TABS
1000.0000 mg | ORAL_TABLET | Freq: Once | ORAL | Status: AC
  Administered 2023-05-31: 1000 mg via ORAL
  Filled 2023-05-31: qty 2

## 2023-05-31 NOTE — Discharge Instructions (Signed)
1.  You may take extra strength Tylenol every 6 hours for headache or neck pain from your fall.  You may put a warm moist heat pack on the back of your neck if this feels good.  You may also try some over-the-counter pain patches like Salonpas. 2.  Return to emergency department immediately if you are suddenly confused your headache suddenly worsens you have sudden loss of balance or other concerning symptoms develop. 3.  You have a small abrasion on the back of your right elbow.  Keep this clean and apply antibiotic ointment and a Band-Aid. 4.  You should have a recheck with your doctor in the next 3 to 5 days.

## 2023-05-31 NOTE — Progress Notes (Signed)
Orthopedic Tech Progress Note Patient Details:  Johnny Zimmerman 09-12-1923 [id number]  Patient ID: BIJAN RIDGLEY, male   DOB: [birth year], 89 y.o.   MRN: [id number] Level 2 trauma Tonye Pearson 05/31/2023, 2:41 PM

## 2023-05-31 NOTE — ED Provider Notes (Signed)
CT chest chest x-ray and labs pending.  Will review.  Patient appears at baseline.  If findings stable, anticipate return to SNF Physical Exam  BP (!) 146/77   Pulse 64   Temp 98.4 F (36.9 C) (Oral)   Resp 13   Ht 5\' 7"  (1.702 m)   Wt 74.8 kg   SpO2 100%   BMI 25.84 kg/m   Physical Exam  Procedures  Procedures  ED Course / MDM    Medical Decision Making Amount and/or Complexity of Data Reviewed Labs: ordered. Radiology: ordered.  Risk OTC drugs.   CT head CT C-spine and chest do not show any acute findings.  Patient is alert.  No acute distress.  He reports he does have some posterior headache.  Will give a dose of extra strength Tylenol.  We also reviewed use of warm moist heat on the back of the neck if there is any tightness or strain.  Patient is a very minor skin tear to the right elbow.  We reviewed keeping this clean with antibiotic ointment and dressing.  Results and plan reviewed with the patient's daughter at bedside.       Arby Barrette, MD 05/31/23 [phone]

## 2023-05-31 NOTE — ED Provider Notes (Signed)
[place] EM[facility]GENCY DEPARTMENT AT [facility] Provider Note   CSN: [id number] Arrival date & time: 05/31/23  1432     History  Chief Complaint  Patient presents with   Marletta Lor    GUSTAVO DISPENZA is a 89 y.o. male.   Fall   Patient has a history of acid reflux, hypertension, Mancia, coronary artery diseas.  Patient presented to the ED for evaluation after a fall.  Patient states he thinks he tripped and stumbled.  He had a fall that was witnessed and he bumped the back of his head.  No loss of consciousness noted.  No laceration was noted.  Patient complains of pain the back of his head but denies any chest pain belly pain.  No extremity pain    Home Medications Prior to Admission medications   Medication Sig Start Date End Date Taking? Authorizing Provider  Artificial Tear Ointment (REFRESH P.M. OP) Apply to eye.    [provider]  aspirin EC 81 MG tablet Take 81 mg by mouth daily.    [provider]  cephALEXin (KEFLEX) 500 MG capsule Take 1 capsule (500 mg total) by mouth 4 (four) times daily. 05/31/14   Gilda Crease, MD  cetirizine (ZYRTEC) 10 MG tablet Take 10 mg by mouth daily.    [provider]  cholecalciferol (VITAMIN D) 1000 UNITS tablet Take 1,000 Units by mouth daily. Chew after a meal    [provider]  docusate sodium (COLACE) 100 MG capsule Take 100 mg by mouth daily. Hold for loose stools    [provider]  doxazosin (CARDURA) 8 MG tablet Take 4 mg by mouth at bedtime.    [provider]  finasteride (PROSCAR) 5 MG tablet Take 5 mg by mouth daily.    [provider]  fluticasone (FLONASE) 50 MCG/ACT nasal spray Place 2 sprays into both nostrils daily. 07/31/14   Pricilla Loveless, MD  ipratropium (ATROVENT) 0.03 % nasal spray Place 2 sprays into both nostrils 2 (two) times daily.    [provider]  methocarbamol (ROBAXIN) 500 MG tablet Take 500 mg by mouth as needed for muscle  spasms.    [provider]  metoprolol tartrate (LOPRESSOR) 25 MG tablet Take 6.25 mg by mouth 2 (two) times daily. 1/4 tablet    [provider]  pantoprazole (PROTONIX) 40 MG tablet Take 40 mg by mouth daily.    [provider]  phenylephrine (HEMORRHOIDAL) 0.25 % suppository Place 1 suppository rectally 2 (two) times daily.    [provider]  polyethylene glycol (MIRALAX / GLYCOLAX) packet Take 17 g by mouth daily.    [provider]  sodium chloride (OCEAN) 0.65 % SOLN nasal spray Place 1 spray into both nostrils as needed for congestion. 07/31/14   Pricilla Loveless, MD  tamsulosin (FLOMAX) 0.4 MG CAPS capsule Take 0.4 mg by mouth daily.    [provider]      Allergies    Codeine    Review of Systems   Review of Systems  Physical Exam Updated Vital Signs BP (!) 143/70   Pulse 84   Temp 98.4 F (36.9 C) (Oral)   Resp 15   Ht 1.702 m (5\' 7" )   Wt 74.8 kg   SpO2 100%   BMI 25.84 kg/m  Physical Exam Vitals and nursing note reviewed.  Constitutional:      Appearance: Normal appearance. He is well-developed. He is not diaphoretic.  HENT:  Head: Normocephalic. No raccoon eyes or Battle's sign.     Comments: No laceration noted    Right Ear: External ear normal.     Left Ear: External ear normal.  Eyes:     General: Lids are normal.        Right eye: No discharge.     Conjunctiva/sclera:     Right eye: No hemorrhage.    Left eye: No hemorrhage. Neck:     Trachea: No tracheal deviation.  Cardiovascular:     Rate and Rhythm: Normal rate and regular rhythm.     Heart sounds: Normal heart sounds.  Pulmonary:     Effort: Pulmonary effort is normal. No respiratory distress.     Breath sounds: Normal breath sounds. No stridor.  Chest:     Chest wall: No tenderness.  Abdominal:     General: Bowel sounds are normal. There is no distension.     Palpations: Abdomen is soft. There is no mass.     Tenderness: There is no  abdominal tenderness.     Comments: Negative for seat belt sign  Musculoskeletal:     Cervical back: No swelling, edema, deformity or tenderness. No spinous process tenderness.     Thoracic back: No swelling, deformity or tenderness.     Lumbar back: No swelling or tenderness.     Comments: Pelvis stable, no ttp, no tenderness palpation of the cervical thoracic or lumbar spine, no tenderness palpation bilateral upper or lower extremities  Neurological:     Mental Status: He is alert.     GCS: GCS eye subscore is 4. GCS verbal subscore is 5. GCS motor subscore is 6.     Sensory: No sensory deficit.     Motor: No abnormal muscle tone.     Comments: Able to move all extremities, sensation intact throughout  Psychiatric:        Mood and Affect: Mood normal.        Speech: Speech normal.        Behavior: Behavior normal.     ED Results / Procedures / Treatments   Labs (all labs ordered are listed, but only abnormal results are displayed) Labs Reviewed  CBC - Abnormal; Notable for the following components:      Result Value   RBC 3.41 (*)    Hemoglobin 11.6 (*)    HCT 35.4 (*)    MCV 103.8 (*)    All other components within normal limits  BASIC METABOLIC PANEL    EKG EKG Interpretation Date/Time:  Friday May 31 2023 14:40:45 EDT Ventricular Rate:  71 PR Interval:    QRS Duration:  147 QT Interval:  453 QTC Calculation: 493 R Axis:   269  Text Interpretation: sinus rhythm Ventricular bigeminy RBBB and LAFB   Confirmed by Linwood Dibbles [phone]) on 05/31/2023 3:06:59 PM  Radiology No results found.  Procedures Procedures    Medications Ordered in ED Medications - No data to display  ED Course/ Medical Decision Making/ A&P                                 Medical Decision Making Amount and/or Complexity of Data Reviewed Labs: ordered. Radiology: ordered.   Presented to the ED for evaluation after a fall.  Initial labs shows hemoglobin 11.6.  This is stable  compared to previous.  Metabolic panel and CT scan are currently pending.  Case turned over to  Dr. Clarice Pole at shift change        Final Clinical Impression(s) / ED Diagnoses Final diagnoses:  None    Rx / DC Orders ED Discharge Orders     None         Linwood Dibbles, MD 05/31/23 [phone]

## 2023-05-31 NOTE — ED Triage Notes (Addendum)
Patient had witnessed fall from standing at [facility] where he lives. Pt on Eliquis and small bump to the back of his head. No LOC.  C collar applied by EMS. Hx Dementia and oriented to his baseline per facility.

## 2024-05-29 NOTE — H&P (Signed)
 Hospitalist Admission History and Physical    Chief Complaint   Possible fall   HPI  89 year old gentleman resident at a facility with past medical history most significant for dementia, CVA, hypertension  His daughter saw him in his usual state of health last week.  Reportedly from facility, that he did not come to get lunch so they went to check on him and found that he was laying down on the floor. He did present for breakfast.  It is unclear what time he had his fall.  Patient has dementia at baseline but is usually alert and oriented x 2.  He also had slurred speech at baseline but this seems to be more significant today per daughter. In ED, he was found to have a fever of 101.3, CT head, CT spine, with no acute fracture CT chest without contrast positive for possible infection  Patient does not use oxygen at home but was reportedly 87% on room air on arrival  Patient not able to relay any symptoms, just feels bad.   Assessment and Plan       Assessment & Plan Fever Given combination of fever, new hypoxia, pneumonia is top on the differential.  Possibly aspiration pneumonia given his reported difficulty with swallowing. Continue ceftriaxone  and azithromycin. Check RVP Blood and urine cultures collected. Holding home BP medications for now.  Restart as appropriate AKI (acute kidney injury) Provide gentle IV hydration. Avoid nephrotoxins. Monitor BMP  Dysphagia. Reportedly failed bedside swallow evaluation. SLP eval in AM. N.p.o. until then.  Macrocytosis. Check folate and B12  History of CVA. Resume Eliquis when able to take p.o.  Hypomagnesemia. Supplemented   The primary contact is: Extended Emergency Contact Information Primary Emergency Contact: Bingley,[name] Mobile Phone: [phone] Relation: Daughter Secondary Emergency Contact: Rasch,[name] Mobile Phone: [phone] Relation: Son   Code Status: DNAR / Total SOTO  DVT Prophylaxis is by resuming  eliquis when able  The current disposition plan is discharge to SNF in 2-3 days.    History  Past Medical and Surgical History Medical History[1]  Surgical History[2]  Family History Family History[3]  Social History  Social History[4]     Allergies  Allergies[5]   Home Medications  Prior to Admission medications  Medication Sig Start Date End Date Taking? Authorizing Provider  allopurinoL (ZYLOPRIM) 100 mg tablet Take 100 mg by mouth daily.   Yes HISTORICAL PROVIDER, CONVERSION  apixaban (Eliquis) 5 mg tab Take 2.5 mg by mouth 2 (two) times a day. 04/26/22  Yes HISTORICAL PROVIDER, CONVERSION  atorvastatin (LIPITOR) 40 mg tablet Take 1 tablet by mouth nightly. 03/12/22  Yes HISTORICAL PROVIDER, CONVERSION  carvediloL (COREG) 25 mg tablet Take 1 tablet by mouth 2 (two) times a day. 03/12/22  Yes HISTORICAL PROVIDER, CONVERSION  cholecalciferol (VITAMIN D3) 1,000 unit (25 mcg) tablet Take 1 tablet by mouth daily. 03/12/22  Yes HISTORICAL PROVIDER, CONVERSION  cyanocobalamin (VITAMIN B12) 500 mcg tablet Take 500 mcg by mouth daily.   Yes HISTORICAL PROVIDER, CONVERSION  famotidine (PEPCID) 20 mg tablet 20 mg nightly. 03/12/22  Yes HISTORICAL PROVIDER, CONVERSION  finasteride (PROSCAR) 5 mg tablet Take 5 mg by mouth daily. 03/12/22  Yes HISTORICAL PROVIDER, CONVERSION  fluticasone  propionate (FLONASE ) 50 mcg/spray nasal spray Administer 2 sprays into each nostril daily. 03/12/22  Yes HISTORICAL PROVIDER, CONVERSION  furosemide (LASIX) 40 mg tablet Take 1 tablet by mouth daily. 03/12/22  Yes HISTORICAL PROVIDER, CONVERSION  gabapentin (NEURONTIN) 300 mg capsule 300 mg 2 (two) times a day. 03/12/22  Yes  HISTORICAL PROVIDER, CONVERSION  loratadine (CLARITIN) 10 mg tablet Take 10 mg by mouth daily. 03/12/22  Yes HISTORICAL PROVIDER, CONVERSION  magnesium oxide 400 mg (241 mg magnesium) tab Take 400 mg by mouth Once Daily. 07/03/22  Yes Norleen Gladis Renshaw, MD  melatonin tablet Take 3 mg by mouth  daily. 07/02/22  Yes HISTORICAL PROVIDER, CONVERSION  mupirocin (BACTROBAN) 2 % ointment Apply 1 Application topically 2 (two) times a day. Abdominal folds   Yes HISTORICAL PROVIDER, CONVERSION  omeprazole 20 mg TbEC Take 1 tablet by mouth daily. 07/02/22  Yes HISTORICAL PROVIDER, CONVERSION  potassium chloride (KLOR-CON) 10 mEq ER tablet Take 10 mEq by mouth daily. 07/02/22  Yes HISTORICAL PROVIDER, CONVERSION  tamsulosin (FLOMAX) 0.4 mg cap Take 0.4 mg by mouth nightly. 03/12/22  Yes HISTORICAL PROVIDER, CONVERSION  acetaminophen  (TYLENOL ) 650 mg ER tablet Take 650 mg by mouth every 6 (six) hours as needed (pain). 07/02/22   HISTORICAL PROVIDER, CONVERSION  diclofenac sodium (VOLTAREN) 1 % gel Apply 1 Application topically every 6 (six) hours as needed. 03/12/22   HISTORICAL PROVIDER, CONVERSION  docusate sodium (COLACE) 100 mg capsule Take 100 mg by mouth daily. 07/02/22   HISTORICAL PROVIDER, CONVERSION  loperamide (IMODIUM A-D) 2 mg tablet Take 2 mg by mouth as needed for diarrhea. 07/02/22   HISTORICAL PROVIDER, CONVERSION  loperamide (IMODIUM A-D) 2 mg tablet Take 4 mg by mouth as needed for diarrhea. 07/02/22   HISTORICAL PROVIDER, CONVERSION       Review of Systems  Pertinent items are noted in HPI.    Physical Exam  Temp:  [97.9 F (36.6 C)-101.3 F (38.5 C)] 97.9 F (36.6 C) Heart Rate:  [70-93] 71 Resp:  [18-30] 20 BP: (96-159)/(54-100) 108/86 There is no height or weight on file to calculate BMI.  General: Alert, oriented to person, In NAD  Cardiovascular: Regular rate and rhythm, s1, s2, no murmurs.  Chest/Lungs: Clear to auscultation bilaterally, no wheezes or rhonchi.  Abdomen: Positive bowel sounds. Abdomen soft, nondistended, nontender.    Psych: calm, poor insight. Full affect       Labs and Results  I have reviewed the following labs and results:  Lab Results (last 24 hours)     Procedure Component Value Ref Range Date/Time   SARS-Cov-2, Flu, and RSV, Qualitative  NAAT [[id number]]  (Normal) Collected: 05/29/24 2028   Lab Status: Final result Specimen: Swab from Nasopharynx Updated: 05/29/24 2158    SARS-CoV-2 Negative Negative     Influenza A Negative Negative     Influenza B Negative Negative     RSV Negative Negative    Narrative:     Results are for use in the simultaneous rapid in vitro detection and differentiation of SARS-CoV-2, RSV, influenza A virus, and influenza B virus nucleic acids by PCR in clinical specimens.   Positive results are indicative of active infection but do not rule out bacterial infection or co-infection with other pathogens not detected by the test. Clinical correlation with patient history and other diagnostic information is necessary to determine patient infection status. The agent detected may not be the definite cause of disease.   Negative results do not preclude SARS-CoV-2, RSV, influenza A, and/or influenza B infection and should not be used as the sole basis for diagnosis, treatment or other patient management decisions. Negative results must be combined with clinical observations, patient history, and/or epidemiological information.   Test performed by [facility] qualified personnel using the Cepheid Xpert SARS-CoV-2 & Influenza A/B Nucleic acid test on  the GeneXpert instrument.    Blood Culture, 1st Set [[id number]] Collected: 05/29/24 1952   Lab Status: In process Specimen: Blood from Venous Updated: 05/29/24 1957   Blood Culture, 2nd Set [[id number]] Collected: 05/29/24 1944   Lab Status: In process Specimen: Blood from Venous Updated: 05/29/24 2002   Lactate, Whole Blood with 2 Hour Reflex [[id number]]  (Normal) Collected: 05/29/24 1944   Lab Status: Final result Specimen: Blood from Venous Updated: 05/29/24 2003    Lactate 1.02 0.90 - 1.70 mmol/L    Urinalysis with Reflex to Microscopic [[id number]]  (Abnormal) Collected: 05/29/24 1834   Lab Status: Final result Specimen: Urine from Clean Catch Updated: 05/29/24 1844     Color, Urine Yellow Yellow     Clarity, Urine Clear Clear     Specific Gravity, Urine 1.014 1.005 - 1.025     pH, Urine 5.5 5.0 - 8.0     Protein, Urine Negative Negative, 10 , 20  mg/dL     Glucose, Urine Negative Negative, 30 , 50  mg/dL     Ketones, Urine Negative Negative, Trace mg/dL     Bilirubin, Urine Negative Negative     Blood, Urine Trace Negative, Trace     Nitrite, Urine Negative Negative     Leukocyte Esterase, Urine 75* Negative, 25     Urobilinogen, Urine Normal <2.0 mg/dL     WBC, Urine 0-5 <6 /HPF     RBC, Urine 3-5* 0 - 2 /HPF     Bacteria, Urine None Seen None Seen, Rare /HPF     Hyaline Casts, Urine 0-2 0 - 2 /LPF    Urine Culture [[id number]] Collected: 05/29/24 1834   Lab Status: In process Specimen: Urine from Clean Catch Updated: 05/29/24 1939   Blood Gas Profile (Venous) [[id number]]  (Abnormal) Collected: 05/29/24 1503   Lab Status: Final result Specimen: Blood from Venous Updated: 05/29/24 1514   Narrative:     The following orders were created for panel order Blood Gas Profile (Venous). Procedure                               Abnormality         Status                    ---------                               -----------         ------                    Blood Gas, [id number]]           Abnormal            Final result               Please view results for these tests on the individual orders.   CBC with Differential [[id number]]  (Abnormal) Collected: 05/29/24 1503   Lab Status: Final result Specimen: Blood from Venous Updated: 05/29/24 1512   Narrative:     The following orders were created for panel order CBC with Differential. Procedure                               Abnormality         Status                    ---------                               -----------         ------  CBC with [id number]]       Abnormal            Final result               Please view results for these tests on the individual  orders.   Comprehensive Metabolic Panel [[id number]]  (Abnormal) Collected: 05/29/24 1503   Lab Status: Final result Specimen: Blood from Venous Updated: 05/29/24 1534    Sodium 137 136 - 145 mmol/L     Potassium 4.1 3.4 - 4.5 mmol/L     Chloride 102 98 - 107 mmol/L     CO2 33* 21 - 31 mmol/L     Comment: High lactate dehydrogenase (LDH) concentrations in patient samples may cause falsely increased bicarbonate results. If markedly elevated LDH levels are suspected, please assess results in conjunction with patient's LDH values.      Anion Gap 2* 6 - 14 mmol/L     Glucose, Random 114* 70 - 99 mg/dL     Blood Urea Nitrogen (BUN) 27* 7 - 25 mg/dL     Creatinine 8.10* 9.29 - 1.30 mg/dL     eGFR 32* >40 fO/fpw/8.26f7     Comment: GFR estimated by CKD-EPI equations(NKF 2021).   Recommend confirmation of Cr-based eGFR by using Cys-based eGFR and other filtration markers (if applicable) in complex cases and clinical decision-making, as needed.      Albumin 4.0 3.5 - 5.7 g/dL     Total Protein 8.6 6.4 - 8.9 g/dL     Bilirubin, Total 1.0 0.3 - 1.0 mg/dL     Alkaline Phosphatase (ALP) 91 34 - 104 U/L     Aspartate Aminotransferase (AST) 13 13 - 39 U/L     Alanine Aminotransferase (ALT) 5* 7 - 52 U/L     Calcium 9.6 8.6 - 10.3 mg/dL     BUN/Creatinine Ratio 14.3 10.0 - 20.0    Blood Gas, Venous [[id number]]  (Abnormal) Collected: 05/29/24 1503   Lab Status: Final result Specimen: Blood from Venous Updated: 05/29/24 1514    pH, Blood Gas 7.350 7.320 - 7.420     pCO2, Venous 64.7* None Defined mmHg     pO2, Venous <30.1 None Defined mmHg     HCO3, Blood Gas 36* 21 - 28 mmol/L     Base Deficit (-) / Base Excess 10.1* -2.0 - 2.0 mmol/L     O2 Saturation, Venous (measured) 23.2* 70.0 - 80.0 %     Source, Blood Gas Venous   CBC with Differential [[id number]]  (Abnormal) Collected: 05/29/24 1503   Lab Status: Final result Specimen: Blood from Venous Updated: 05/29/24 1512    WBC 8.64 4.40 - 11.00  10*3/uL     RBC 3.63* 4.50 - 5.90 10*6/uL     Hemoglobin 12.7* 14.0 - 17.5 g/dL     Hematocrit 61.7* 58.4 - 50.4 %     Mean Corpuscular Volume (MCV) 105.4* 80.0 - 96.0 fL     Mean Corpuscular Hemoglobin (MCH) 35.0* 27.5 - 33.2 pg     Mean Corpuscular Hemoglobin Conc (MCHC) 33.3 33.0 - 37.0 g/dL     Red Cell Distribution Width (RDW) 14.8 12.3 - 17.0 %     Platelet Count (PLT) 181 150 - 450 10*3/uL     Mean Platelet Volume (MPV) 7.8 6.8 - 10.2 fL     Neutrophils % 82 %     Lymphocytes % 7 %     Monocytes % 10 %     Eosinophils %  1 %     Basophils % 0 %     Neutrophils Absolute 7.10 1.80 - 7.80 10*3/uL     Lymphocytes # 0.60* 1.00 - 4.80 10*3/uL     Monocytes # 0.90* 0.00 - 0.80 10*3/uL     Eosinophils # 0.10 0.00 - 0.50 10*3/uL     Basophils # 0.00 0.00 - 0.20 10*3/uL    Procalcitonin [[id number]]  (Normal) Collected: 05/29/24 1503   Lab Status: Final result Specimen: Blood from Venous Updated: 05/29/24 1841    Procalcitonin 0.06 0.00 - 0.20 ng/mL    Magnesium [[id number]]  (Abnormal) Collected: 05/29/24 1503   Lab Status: Final result Specimen: Blood from Venous Updated: 05/29/24 1837    Magnesium 1.6* 1.9 - 2.7 mg/dL    CK, Total [[id number]]  (Normal) Collected: 05/29/24 1503   Lab Status: Final result Specimen: Blood from Venous Updated: 05/29/24 1851    CK, Total 43 30 - 223 U/L         Radiology: CT Chest WO Contrast  Final Result by Arthea Frances Pouch, MD (10/31 2109)  CT CHEST WO CONTRAST, 05/29/2024 7:29 PM    INDICATION: rule out pneumonia     COMPARISON:    TECHNIQUE: Multislice axial images were obtained through the chest without   administration of iodinated intravenous contrast material. Multi-planar   reformatted images were generated for additional analysis. Nongated   technique limits cardiac detail.    All CT scans at [facility] and [facility]   Imaging are performed using radiation dose optimization techniques as    appropriate to a performed exam, including but not limited to one or more   of the following: automatic exposure control, adjustment of the mA and/or   kV according to patient size, use of iterative reconstruction technique.   In addition, our institution participates in a radiation dose monitoring   program to optimize patient radiation exposure.    FINDINGS:     Thoracic inlet/central airways: Visualized thyroid  gland is normal.    Central airway is patent.  No cervical lymphadenopathy.     Mediastinum/hila/axilla: Borderline enlarged level 2R (upper paratracheal)   mediastinal lymph node measuring 1 cm in short axis. There are a few   additional scattered multistation prominent, though not pathologically   enlarged, mediastinal lymph nodes.    Heart/vessels: Biatrial enlargement. No pericardial effusion.  Aortic   valvular and aortic root calcification There is moderate coronary artery   calcification. Trace venous gas, likely related to IV placement. Aorta is   normal in caliber.      Lungs/pleura: Questionable faint groundglass opacities in the left upper   lobe (series 3 images 89, 94, 103) and possibly bilateral lower lobes   (3/120, 130, 136), suboptimally visualized secondary to excessive   respiratory motion artifact. No dominant or suspicious nodules, focal   consolidation, effusion, or pneumothorax.     Upper abdomen: Possible cholelithiasis, incompletely visualized. No acute   process in the visualized upper abdomen.     Chest wall/MSK: No acute osseous findings or aggressive osseous lesion.     IMPRESSION:    1.  Suboptimal evaluation of the pulmonary parenchyma secondary to   excessive respiratory motion artifact. Within this limitation, there are   possible faint groundglass opacities in the left upper and the bilateral   lower lobes which are nonspecific though could potentially be related to   an infectious or inflammatory etiology.  2.  Borderline enlarged  mediastinal lymph  node, possibly reactive.      CT Head WO Contrast  Final Result by Debby Arlyss Mose, MD (10/31 1604)  CT HEAD WITHOUT CONTRAST, 05/29/2024 3:51 PM    INDICATION: fall     COMPARISON: None    TECHNIQUE: Axial CT images of the brain from skull base to vertex,   including portions of the face and sinuses, were obtained without   contrast. Supplemental 2D reformatted images were generated and reviewed   as needed.    All CT scans at [facility] and [facility]   Imaging are performed using radiation dose optimization techniques as   appropriate to a performed exam, including but not limited to one or more   of the following: automatic exposure control, adjustment of the mA and/or   kV according to patient size, use of iterative reconstruction technique.   In addition, our institution participates in a radiation dose monitoring   program to optimize patient radiation exposure.    FINDINGS:  Calvarium/skull base: No evidence of acute fracture or destructive lesion.   Mastoids and middle ears demonstrate no substantial mucosal disease.    Paranasal sinuses: No air fluid levels.    Brain: Moderately severe cerebral atrophy similar to prior. Patchy   hypoattenuation within the periventricular and deep white matter most   consistent with chronic microvascular ischemic changes (leukoaraiosis),   similar to prior. Moderately severe atherosclerotic calcifications of the   intracranial internal carotid arteries again noted. Bilateral pseudophakia   is similar to prior. Remote right basal ganglia lacune similar to prior.   No acute large vascular territory infarct. No mass effect. No   hydrocephalus. No acute hemorrhage.    IMPRESSION:  1.  No acute intracranial abnormality. Although no evidence of acute   infarction, mass, or hemorrhage is seen, CT is relatively insensitive for   the detection of infarct within the first 24-48 hours, and  an MRI scan may   be indicated.  2.  Moderately severe cerebral atrophy, leukoaraiosis and remote right   basal ganglia lacune similar to prior.      CT Spine Cervical WO Contrast  Final Result by Debby Arlyss Mose, MD (10/31 1604)  CT CERVICAL SPINE WITHOUT CONTRAST, 05/29/2024 3:51 PM    INDICATION: fall     COMPARISON: None    TECHNIQUE: Thin-section axial CT images of the entire cervical spine were   acquired without contrast. Supplemental 2D reformatted images were   generated and reviewed as needed.    All CT scans at [facility] and [facility]   Imaging are performed using radiation dose optimization techniques as   appropriate to a performed exam, including but not limited to one or more   of the following: automatic exposure control, adjustment of the mA and/or   kV according to patient size, use of iterative reconstruction technique.   In addition, our institution participates in a radiation dose monitoring   program to optimize patient radiation exposure.    LEVELS IMAGED: Foramen magnum to upper thoracic region.    FINDINGS:  Alignment: No acute traumatic malalignment.    Craniocervical junction: No evidence of acute fracture or dislocation.    Vertebrae: No acute fractures. Vertebral body heights maintained.    Degenerative changes: Multilevel severe cervical spondylosis with mild   dextroscoliosis contributes to multilevel moderate to severe neural   foraminal stenosis and acquired spinal canal stenosis greatest at C3-C4.    IMPRESSION:  No  evidence of acute fracture or traumatic malalignment of the cervical   spine.    XR Chest 1 View  Final Result by Karlton Marilou Ferron, MD (10/31 1617)  XR CHEST 1 VIEW, 05/29/2024 3:06 PM    INDICATION:dyspnea   COMPARISON: None    FINDINGS:     Supportive devices: None  Cardiovascular/lungs/pleura: Mildly enlarged cardiomediastinal silhouette.   Bibasilar  atelectasis. No pleural effusion or pneumothorax.  Other: No acute displaced fractures.    IMPRESSION:  *  Mild cardiomegaly without overt pulmonary edema.  *   Bibasilar atelectasis                       [1] Past Medical History: Diagnosis Date  . BPH (benign prostatic hyperplasia) 01/06/2015  . Cerebrovascular disease 06/05/2021  . Essential hypertension 01/06/2015  . Peripheral vascular disease 01/06/2015  [2] No past surgical history on file. [3] No family history on file. [4] Social History Socioeconomic History  . Marital status: Widowed   Social Drivers of Health   Food Insecurity: Low Risk  (07/02/2022)   [facility] prior to 09/29/2022.   Food   . Within the past 12 months, you worried that your food would run out before you got money to buy more food: Never true   . Within the past 12 months, the food you bought just didn't last and you didn't have money to get more: Never true  Transportation Needs: No Transportation Needs (07/02/2022)   Received from [facility] visits prior to 09/29/2022.   Transportation   . In the past 12 months, has lack of reliable transportation kept you from medical appointments, meetings, work or from getting things needed for daily living?: No  Safety: Not At Risk (12/14/2023)   Received from [facility]   HITS   . Over the last 12 months how often did your partner scream or curse at you?: Never   . Over the last 12 months how often did your partner physically hurt you?: Never   . Over the last 12 months how often did your partner insult you or talk down to you?: Never   . Over the last 12 months how often did your partner threaten you with physical harm?: Never  Living Situation: Low Risk  (07/02/2022)   Received from [facility] visits prior to 09/29/2022.   Living Needs   . What is your living situation today?: I have a steady place to live    . Think about the place you live.  Do you have problems with any of the following? (Choose all that apply): None of the above  [5] Allergies Allergen Reactions  . Codeine Other (See Comments)    unknown  *Some images could not be shown.

## 2024-05-29 NOTE — ED Provider Notes (Signed)
 Emergency Department Provider Note  Chief Complaint  Patient presents with  . Fall   History  *Chart reviewed and pertinent medical history confirmed with patient and/or external source collateral (as needed).  History of Present Illness 89 year old male with CVA, dementia, atrial fibrillation, hypertension, and CKD presenting after a fall. Found down in nursing home; etiology unclear.   Last seen 05/23/2024 with normal speech and cognition (though daughter said he does have a small level of dysarthria but not what patient currently is presenting with). Found on bathroom floor 05/29/2024, confused, staff reported slurred speech around 12:00 PM.  No typical oxygen support, No smoking history.   Reports lightheadedness and dizziness before fall, no pain. Acknowledges speech change.     Previous Chart Review  Last ED visit was back in May  Past Medical History Medical History[1]  Past Surgical History Surgical History[2]   Medications Current Outpatient Medications  Medication Instructions  . acetaminophen  (TYLENOL ) 650 mg, oral, Every 6 hours PRN  . allopurinoL (ZYLOPRIM) 100 mg, Daily  . atorvastatin (LIPITOR) 40 mg tablet 1 tablet, oral, Nightly  . carvediloL (COREG) 25 mg tablet 1 tablet, 2 times daily  . cholecalciferol (VITAMIN D3) 1,000 unit (25 mcg) tablet 1 tablet, Daily  . cyanocobalamin (VITAMIN B12) 500 mcg, Daily  . diclofenac sodium (VOLTAREN) 1 % gel 1 Application, topical, Every 6 hours PRN  . docusate sodium (COLACE) 100 mg, oral, Daily  . Eliquis 2.5 mg, 2 times daily  . famotidine (PEPCID) 20 mg, Nightly  . finasteride (PROSCAR) 5 mg, Daily  . fluticasone  propionate (FLONASE ) 50 mcg/spray nasal spray 2 sprays, Daily  . furosemide (LASIX) 40 mg tablet 1 tablet, Daily  . gabapentin (NEURONTIN) 300 mg, 2 times daily  . loperamide (IMODIUM A-D) 2 mg, oral, As needed  . loperamide (IMODIUM A-D) 4 mg, oral, As needed  . loratadine (CLARITIN) 10 mg, Daily  .  magnesium oxide 400 mg, oral, Daily  . melatonin 3 mg, Daily  . mupirocin (BACTROBAN) 2 % ointment 1 Application, 2 times daily  . omeprazole 20 mg TbEC 1 tablet, Daily  . potassium chloride (KLOR-CON) 10 mEq ER tablet 10 mEq, Daily  . tamsulosin (FLOMAX) 0.4 mg, Nightly   Allergies Allergies[3]  Family History Family History[4]  Social History Social History[5] Physical Exam   Vitals:   05/29/24 1800 05/29/24 1854 05/29/24 1855 05/29/24 1902  BP: 159/76 159/76  137/78  BP Location:  Right arm    Patient Position:  Lying    Pulse: 93 93  88  Resp:  (!) 30  (!) 30  Temp:  (!) 101.3 F (38.5 C)    TempSrc:  Rectal    SpO2: 95% 95% 95% 97%     =  Physical Exam General Appearance: Warm,  Vital signs: Within normal limits. HEENT: normocephalic, supple w/o meningismus, pupils equal/round/reactive, extraocular movements intact.  No signs of head trauma, no C-spine tenderness. Respiratory: Crackles, requiring oxygen. Cardiovascular: normal rate/ normal rhythm, no audible murmurs. Gastrointestinal: abdomen soft, non-tender, non-distended, no guarding/rebound.    No C, T, L-spine tenderness on palpation  Extremities: warm, dry, no pitting edema, 2+ pulses in b/l lower extremities. Skin: Warm and dry, no rash. Neurological:  dysarthria, moving all 4 extremities   Medical Decision Making (MDM)   Differential Diagnosis DDX: Pneumonia, intraabdominal infections (e.g., peritonitis, intraabdominal abscess), pyelonephritis, skin and soft tissue infections (including cellulitis and necrotizing fasciitis), bloodstream infections from implanted devices (e.g., central venous catheter, port-a-cath, urinary catheter), endocarditis, meningitis, osteomyelitis,  infected surgical wounds, ventilator-associated pneumonia, and hospital-acquired urinary tract infections.  Fall, ICH, spinal fracture  ED Interventions and Subspecialty Consults Orders Placed This Encounter  Procedures  . Blood  Culture, 1st Set  . Blood Culture, 2nd Set  . Urine Culture  . CT Head WO Contrast  . CT Spine Cervical WO Contrast  . XR Chest 1 View  . CT Chest WO Contrast  . Blood Gas Profile (Venous)  . CBC with Differential  . Comprehensive Metabolic Panel  . Blood Gas, Venous  . CBC with Differential  . Procalcitonin  . Magnesium  . Urinalysis with Reflex to Microscopic  . CK, Total  . Lactate, Whole Blood with 2 Hour Reflex  . SARS-Cov-2, Flu, and RSV, Qualitative NAAT  . Check temperature - Rectal  . Initiate Treatment Timer-3  . ED Monitor with Pulse Ox  . Vital Signs  . Height and weight  . PRN adapter/PIV  . Nursing communication IF patient unable to void for GREATER THAN 30 minutes, obtain in and out catheter specimen for Urine Culture  . Inpatient consult to Hospitalist  . ECG 12 lead  . ECG 12 lead  . Admit to Inpatient    New Medications Ordered This Visit  Medications  . acetaminophen  (TYLENOL ) tablet 650 mg  . magnesium sulfate 2g in sterile water 50 mL IVPB  . acetaminophen  (TYLENOL ) suppository 650 mg  . cefTRIAXone  (ROCEPHIN ) injection 2 g    Primary System Affected (Source)::   Pulmonary    Type of Infection (Pulmonary)::   Community Acquired Pneumonia  . azithromycin (ZITHROMAX) 500 mg in sodium chloride 0.9 % 250 mL IVPB    Primary System Affected (Source)::   Pulmonary    Type of Infection (Pulmonary)::   Community Acquired Pneumonia  . ketorolac (TORADOL) injection 15 mg    Procedures Critical Care  Performed by: Miquel Lax, MD Authorized by: Miquel Lax, MD   Critical care provider statement:    Critical care time (minutes):  43   Critical care time was exclusive of:  Separately billable procedures and treating other patients   Critical care was necessary to treat or prevent imminent or life-threatening deterioration of the following conditions:  Respiratory failure, sepsis and renal failure   Critical care was time spent personally by me on the  following activities:  Ordering and performing treatments and interventions, ordering and review of laboratory studies, ordering and review of radiographic studies, pulse oximetry, discussions with consultants, evaluation of patient's response to treatment, re-evaluation of patient's condition, review of old charts, examination of patient, obtaining history from patient or surrogate and development of treatment plan with patient or surrogate   I assumed direction of critical care for this patient from another provider in my specialty: no     Care discussed with: admitting provider        Independent Interpretation  *All imaging, laboratory, and diagnostic studies reviewed and independently interpreted by me, pertinent findings outlined below.   Lab and Imaging Interpretation : I have independently reviewed the following labs and imaging and my interpretation is as follows CBC without white count, CK unremarkable, magnesium 1.6, Pro-Cal negative, CMP with mild AKI, UA without infection, just leuks and red blood cells, pCO2 is 64, x-ray without dense consolidation, CT head without ICH  EKG Interpretation: My interpretation of the EKG is as follows Rate 96, A-fib, PVCs, no obvious STEMI  Cardiac Monitoring Interpretation: no acute distress, no tachycardia, mild tachypnea, no hypotension, febrile  Assessment & Plan The patient is a 89 year old male with a history of cerebrovascular accident (CVA), dementia, atrial fibrillation, hypertension, and chronic kidney disease (CKD), who presented after a fall with unclear etiology.   During the emergency department course, a head CT revealed no bleed, and a chest x-ray was inconclusive for pneumonia. The patient continued to require oxygen. Blood tests showed no signs of infection, normal electrolytes, but elevated kidney function at 1.89 and high pCO2. A urinalysis was performed to check for a urinary infection.  Elevated pCO2 and lung crackles  raised concerns for infection, prompting a broad infectious workup including a urine test and chest CT. The requirement for oxygen and elevated pCO2 also suggested pneumonia, warranting a chest CT for a better view.  admit for fall; on thinners. EKG afib without RVR or stemi. dysarthria, slurred speech, aki, fall and ams.hypoxic resp failure with hypercarbia, normal ph  I asked the nursing staff to do a rectal temp after I left the room since patient felt warm.  His temperature is 101, they just recorded it..  I ordered blood cultures.  Started ceftriaxone  and azithromycin given presumed pneumonia now that he is requiring oxygen.  Antibiotics broadened if needed.  Nurse stated he did not tolerate his bedside swallow test.  He will need an MRI  ED Course as of 05/29/24 1957  Fri May 29, 2024  1811 Hx of stroke, dementia, atrial fibrillation on Eliquis, hypertension, chronic kidney disease, and spinal stenosis [HN]  1813 Rate 96, A-fib, PVCs, no obvious STEMI [HN]  1833 dysarthria, slurred speech, aki, fall and ams.hypoxic resp failure with hypercarbia [HN]  1838 admit for fall; on thinners. EKG afib without RVR or stemi. dysarthria, slurred speech, aki, fall and ams.hypoxic resp failure with hypercarbia, normal ph [HN]    ED Course User Index [HN] Miquel Lax, MD    Dispo as outlined below.  Overall Clinical Impression:  1. Acute respiratory failure with hypoxia    (CMD)   2. Fall, initial encounter   3. AKI (acute kidney injury)   4. Hypercarbia    ED Disposition  Admit  Clinical Complexity Past medical/surgical history and previous CVA increasing complexity of ED encounter outlined above in HPI. As stated in HPI, further history obtained from:Family Member  and Manufacturing Systems Engineer. Factors Impacting ED Encounter Risk: Discussion regarding hospitalization. Patient presentation most consistent with acute presentation with potential threat to life or bodily function.    *Provider  time spent in patient care today, inclusive of (but not limited to) review of medical records, real-time clinical reassessment, review of diagnostic studies, and discharge preparation, was greater than 30 minutes.  *This document was created using the aid of voice recognition Dragon dictation software.       [1] Past Medical History: Diagnosis Date  . BPH (benign prostatic hyperplasia) 01/06/2015  . Cerebrovascular disease 06/05/2021  . Essential hypertension 01/06/2015  . Peripheral vascular disease 01/06/2015  [2] No past surgical history on file. [3] Allergies Allergen Reactions  . Codeine Other (See Comments)    unknown  [4] No family history on file. [5]

## 2024-06-01 NOTE — Discharge Summary (Signed)
 Hospital Medicine Discharge Summary   Demographics: Johnny Zimmerman  89 y.o. Jul 05, 1924 MRN: [id number]    Extended Emergency Contact Information Primary Emergency Contact: Mccasland,[name] Mobile Phone: [phone] Relation: Daughter  DNAR / Total SOTO  Admit Date: 05/29/2024                            Attending Physician: Jennings Graciela Keeler* Discharge Date: 06/01/2024  Primary Care Provider: PCP Needed PPI   None  Consults during this admission: Consult Orders             IP CONSULT TO HOSPITALIST       Provider:  (Not yet assigned)              Active & Resolved Diagnosis: Principal Problem:   AKI (acute kidney injury) Resolved Problems:   Fever, likely 2/2 CAP   Disposition: Patient discharged to Assisted Living Facility in fair condition.   Discharge follow-up recommendations : See Hospital Course    Hospital Course:  89 year old man with a PMH significant for dementia, CVA, HTN, brought from his facility because he was found laying on the floor, unclear if/when he fell, found to have a fever of 101.3 and pulse ox 87% on admission, CT chest with possibility of infection. Assessment & Plan Fever, likely 2/2 CAP, resolved Given combination of fever, new hypoxia, pneumonia is top on the differential.  Possibly aspiration pneumonia given his reported difficulty with swallowing. Continue ceftriaxone  and azithromycin. Check RVP: Negative Blood cultures no growth so far Urine cultures negative Holding home BP medications for now.  Restart as appropriate AKI (acute kidney injury) - Significant improvement Has been on gentle IV hydration. Avoid nephrotoxins. Monitor BMP    Dysphagia. Reportedly failed bedside swallow evaluation. SLP eval: Intact oropharyngeal swallow, regular diet and thin liquids, routine oral care with nursing.     Macrocytosis. Check folate and B12, both normal   History of CVA. Resume Eliquis when able to take p.o.    Hypomagnesemia.  Resolved Supplemented     Hypertension -Well-controlled on carvedilol  On the day of discharge he was seen lying in bed in no apparent distress.  Had some mild expiratory wheezing, saturating well on room air.  Symptoms improved with breathing treatments.              Wound / Incision Assessment: Refer to Chart Review and Media Tab for images if available.      Temp:  [97.5 F (36.4 C)-97.9 F (36.6 C)] 97.9 F (36.6 C) Heart Rate:  [74-85] 82 Resp:  [17-18] 18 BP: (140-152)/(89-93) 150/93  Anticoagulant Medications     Direct Factor Xa Inhibitors Start End   * apixaban (Eliquis) 5 mg tab 04/26/2022 --   Class: Historical Med   Notes to Pharmacy: Take 0.5 tablets (2.5 mg total) by mouth 2 times daily. Start date09/28/2023:         Discharge Medications     PAUSE taking these medications      Sig Disp Refill Start End  furosemide 40 mg tablet Wait to take this until your doctor or other care provider tells you to start again. Commonly known as: LASIX  Take 1 tablet by mouth daily.   0         New Medications      Sig Disp Refill Start End  cefdinir 300 mg capsule Commonly known as: OMNICEF  Take 1 capsule (300 mg total) by mouth daily  for 1 dose.  1 capsule  0  June 02, 2024        Modified Medications      Sig Disp Refill Start End  gabapentin 100 mg capsule Commonly known as: NEURONTIN What changed:  medication strength how much to take how to take this  Take 1 capsule (100 mg total) by mouth 2 (two) times a day.  180 capsule  0         Medications To Continue      Sig Disp Refill Start End  acetaminophen  650 mg ER tablet Commonly known as: TYLENOL   Take 650 mg by mouth every 6 (six) hours as needed (pain).   0     allopurinoL 100 mg tablet Commonly known as: ZYLOPRIM  Take 100 mg by mouth daily.   0     atorvastatin 40 mg tablet Commonly known as: LIPITOR  Take 1 tablet by mouth nightly.   0      carvediloL 25 mg tablet Commonly known as: COREG  Take 1 tablet by mouth 2 (two) times a day.   0     cholecalciferol 1,000 unit (25 mcg) tablet Commonly known as: VITAMIN D3  Take 1 tablet by mouth daily.   0     cyanocobalamin 500 mcg tablet Commonly known as: VITAMIN B12  Take 500 mcg by mouth daily.   0     diclofenac sodium 1 % gel Commonly known as: VOLTAREN  Apply 1 Application topically every 6 (six) hours as needed.   0     docusate sodium 100 mg capsule Commonly known as: COLACE  Take 100 mg by mouth daily.   0     Eliquis 5 mg Tab Generic drug: apixaban  Take 2.5 mg by mouth 2 (two) times a day.   0     famotidine 20 mg tablet Commonly known as: PEPCID  20 mg nightly.   0     finasteride 5 mg tablet Commonly known as: PROSCAR  Take 5 mg by mouth daily.   0     fluticasone  propionate 50 mcg/spray nasal spray Commonly known as: FLONASE   Administer 2 sprays into each nostril daily.   0     * loperamide 2 mg tablet Commonly known as: IMODIUM A-D  Take 2 mg by mouth as needed for diarrhea.   0     * loperamide 2 mg tablet Commonly known as: IMODIUM A-D  Take 4 mg by mouth as needed for diarrhea.   0     loratadine 10 mg tablet Commonly known as: CLARITIN  Take 10 mg by mouth daily.   0     magnesium oxide 400 mg (241 mg magnesium) Tab  Take 400 mg by mouth Once Daily.  180 tablet  0     melatonin 3 mg tablet  Take 3 mg by mouth daily.   0     mupirocin 2 % ointment Commonly known as: BACTROBAN  Apply 1 Application topically 2 (two) times a day. Abdominal folds   0     omeprazole 20 mg Tbec  Take 1 tablet by mouth daily.   0     potassium chloride 10 mEq ER tablet Commonly known as: KLOR-CON  Take 10 mEq by mouth daily.   0     tamsulosin 0.4 mg Cap Commonly known as: FLOMAX  Take 0.4 mg by mouth nightly.   0        * * There are  duplicate medications prescribed to the patient            Speech Therapy  Recommendations: Rehab Potential Rehab Potential: Good Complexity/Co-morbidities that Impact Plan of Care: Cognitive/Memory deficits     Lab Results  Component Value Date/Time   HGB 10.4 (L) 06/01/2024 12:17 AM   HCT 30.6 (L) 06/01/2024 12:17 AM   WBC 5.91 06/01/2024 12:17 AM   PLT 138 (L) 06/01/2024 12:17 AM   Lab Results  Component Value Date/Time   NA 138 06/01/2024 12:17 AM   K 3.9 06/01/2024 12:17 AM   CREATININE 1.36 (H) 06/01/2024 12:17 AM   BUN 22 06/01/2024 12:17 AM   GLUCOSE 122 (H) 06/01/2024 12:17 AM    Pertinent Imaging: CT Chest WO Contrast  Final Result by Arthea Frances Pouch, MD (10/31 2109)  CT CHEST WO CONTRAST, 05/29/2024 7:29 PM    INDICATION: rule out pneumonia     COMPARISON:    TECHNIQUE: Multislice axial images were obtained through the chest without   administration of iodinated intravenous contrast material. Multi-planar   reformatted images were generated for additional analysis. Nongated   technique limits cardiac detail.    All CT scans at [facility] and [facility]   Imaging are performed using radiation dose optimization techniques as   appropriate to a performed exam, including but not limited to one or more   of the following: automatic exposure control, adjustment of the mA and/or   kV according to patient size, use of iterative reconstruction technique.   In addition, our institution participates in a radiation dose monitoring   program to optimize patient radiation exposure.    FINDINGS:     Thoracic inlet/central airways: Visualized thyroid  gland is normal.    Central airway is patent.  No cervical lymphadenopathy.     Mediastinum/hila/axilla: Borderline enlarged level 2R (upper paratracheal)   mediastinal lymph node measuring 1 cm in short axis. There are a few   additional scattered multistation prominent, though not pathologically   enlarged, mediastinal lymph nodes.    Heart/vessels:  Biatrial enlargement. No pericardial effusion.  Aortic   valvular and aortic root calcification There is moderate coronary artery   calcification. Trace venous gas, likely related to IV placement. Aorta is   normal in caliber.      Lungs/pleura: Questionable faint groundglass opacities in the left upper   lobe (series 3 images 89, 94, 103) and possibly bilateral lower lobes   (3/120, 130, 136), suboptimally visualized secondary to excessive   respiratory motion artifact. No dominant or suspicious nodules, focal   consolidation, effusion, or pneumothorax.     Upper abdomen: Possible cholelithiasis, incompletely visualized. No acute   process in the visualized upper abdomen.     Chest wall/MSK: No acute osseous findings or aggressive osseous lesion.     IMPRESSION:    1.  Suboptimal evaluation of the pulmonary parenchyma secondary to   excessive respiratory motion artifact. Within this limitation, there are   possible faint groundglass opacities in the left upper and the bilateral   lower lobes which are nonspecific though could potentially be related to   an infectious or inflammatory etiology.  2.  Borderline enlarged mediastinal lymph node, possibly reactive.      CT Head WO Contrast  Final Result by Debby Arlyss Mose, MD (10/31 1604)  CT HEAD WITHOUT CONTRAST, 05/29/2024 3:51 PM    INDICATION: fall     COMPARISON: None    TECHNIQUE: Axial CT images  of the brain from skull base to vertex,   including portions of the face and sinuses, were obtained without   contrast. Supplemental 2D reformatted images were generated and reviewed   as needed.    All CT scans at [facility] and [facility]   Imaging are performed using radiation dose optimization techniques as   appropriate to a performed exam, including but not limited to one or more   of the following: automatic exposure control, adjustment of the mA and/or   kV according to patient size, use  of iterative reconstruction technique.   In addition, our institution participates in a radiation dose monitoring   program to optimize patient radiation exposure.    FINDINGS:  Calvarium/skull base: No evidence of acute fracture or destructive lesion.   Mastoids and middle ears demonstrate no substantial mucosal disease.    Paranasal sinuses: No air fluid levels.    Brain: Moderately severe cerebral atrophy similar to prior. Patchy   hypoattenuation within the periventricular and deep white matter most   consistent with chronic microvascular ischemic changes (leukoaraiosis),   similar to prior. Moderately severe atherosclerotic calcifications of the   intracranial internal carotid arteries again noted. Bilateral pseudophakia   is similar to prior. Remote right basal ganglia lacune similar to prior.   No acute large vascular territory infarct. No mass effect. No   hydrocephalus. No acute hemorrhage.    IMPRESSION:  1.  No acute intracranial abnormality. Although no evidence of acute   infarction, mass, or hemorrhage is seen, CT is relatively insensitive for   the detection of infarct within the first 24-48 hours, and an MRI scan may   be indicated.  2.  Moderately severe cerebral atrophy, leukoaraiosis and remote right   basal ganglia lacune similar to prior.      CT Spine Cervical WO Contrast  Final Result by Debby Arlyss Mose, MD (10/31 1604)  CT CERVICAL SPINE WITHOUT CONTRAST, 05/29/2024 3:51 PM    INDICATION: fall     COMPARISON: None    TECHNIQUE: Thin-section axial CT images of the entire cervical spine were   acquired without contrast. Supplemental 2D reformatted images were   generated and reviewed as needed.    All CT scans at [facility] and [facility]   Imaging are performed using radiation dose optimization techniques as   appropriate to a performed exam, including but not limited to one or more   of the following: automatic  exposure control, adjustment of the mA and/or   kV according to patient size, use of iterative reconstruction technique.   In addition, our institution participates in a radiation dose monitoring   program to optimize patient radiation exposure.    LEVELS IMAGED: Foramen magnum to upper thoracic region.    FINDINGS:  Alignment: No acute traumatic malalignment.    Craniocervical junction: No evidence of acute fracture or dislocation.    Vertebrae: No acute fractures. Vertebral body heights maintained.    Degenerative changes: Multilevel severe cervical spondylosis with mild   dextroscoliosis contributes to multilevel moderate to severe neural   foraminal stenosis and acquired spinal canal stenosis greatest at C3-C4.    IMPRESSION:  No evidence of acute fracture or traumatic malalignment of the cervical   spine.    XR Chest 1 View  Final Result by Karlton Marilou Ferron, MD (10/31 1617)  XR CHEST 1 VIEW, 05/29/2024 3:06 PM    INDICATION:dyspnea   COMPARISON: None  FINDINGS:     Supportive devices: None  Cardiovascular/lungs/pleura: Mildly enlarged cardiomediastinal silhouette.   Bibasilar atelectasis. No pleural effusion or pneumothorax.  Other: No acute displaced fractures.    IMPRESSION:  *  Mild cardiomegaly without overt pulmonary edema.  *   Bibasilar atelectasis          Electronically signed by: Jennings Graciela Keeler, MD 06/01/2024 2:58 PM   Time spent on discharge: 40 minutes  *Some images could not be shown.

## 2024-06-09 NOTE — ED Provider Notes (Signed)
 [facility] EMERGENCY DEPARTMENT  History   Chief Complaint: AMS.  History of Present Illness:  History obtained from: patient, EMS, family at bedside.    Independent history obtained from family at bedside, as patient is unable to provide adequate history due to dementia.   Johnny Zimmerman is a 89 y.o. male who presents to the ED via EMS from [facility] with report of worsening altered mental status and generalized pain.  Patient was discharged from this facility on 06/01/24 with diagnosis of AKI; reportedly has been increasingly confused.  Family reports patient was discharged on 06/01/24 with diagnosis of pneumonia ~ received IV antibiotics while in the hospital was discharge with Cefdinir 300mg . Family reports slurred speech, patient with history of dementia. Family reports staff advised her patient has been screaming out in pain. Family states he has been complaining of generalized pain.  Family also reports patient has choked a few times when trying to eat at the facility.     ______________________ ROS: Pertinent positives and negatives per HPI. Pertinent past medical, surgical, social and family history records were reviewed. Current Medications and Allergies were reviewed.  Physical Exam   Vitals:   06/09/24 0243 06/09/24 0515 06/09/24 0545 06/09/24 0615  BP: 141/89 149/84 (!) 152/104   BP Location: Right arm     Patient Position: Sitting     Pulse: 76 86 88 92  Resp: (!) 22 (!) 23 20 18   Temp:      SpO2: 94% 96% 96% 93%  Weight:      Height:          Physical Exam Vitals and nursing note reviewed.  Constitutional:      General: He is not in acute distress.    Appearance: He is not toxic-appearing or diaphoretic.  HENT:     Head: Normocephalic and atraumatic.     Right Ear: External ear normal.     Left Ear: External ear normal.     Nose: Nose normal.     Mouth/Throat:     Mouth: Mucous membranes are dry.     Pharynx: Oropharynx is  clear.  Eyes:     Extraocular Movements: Extraocular movements intact.     Conjunctiva/sclera: Conjunctivae normal.     Pupils: Pupils are equal, round, and reactive to light.  Cardiovascular:     Rate and Rhythm: Normal rate and regular rhythm.     Pulses: Normal pulses.     Heart sounds: Normal heart sounds.  Pulmonary:     Effort: Pulmonary effort is normal. No respiratory distress.     Breath sounds: No stridor. Wheezing present. No rhonchi or rales.  Abdominal:     General: Abdomen is flat. Bowel sounds are normal. There is no distension.     Palpations: Abdomen is soft.     Tenderness: There is no abdominal tenderness. There is no guarding or rebound.  Musculoskeletal:        General: No signs of injury.     Cervical back: Normal range of motion. No rigidity or tenderness.     Right lower leg: No edema.     Left lower leg: No edema.  Skin:    General: Skin is warm and dry.     Capillary Refill: Capillary refill takes less than 2 seconds.     Findings: No rash.  Neurological:     General: No focal deficit present.     Mental Status: He is alert.  Sensory: No sensory deficit.     Motor: No weakness.     Comments: Follows commands and answers some questions appropriately, but is intermittently confused/disoriented.     Results   CXR Impression: (Interpreted by me) No acute disease.   Labs: Labs Reviewed  COMPREHENSIVE METABOLIC PANEL - Abnormal      Result Value   Sodium 141     Potassium 4.7 (*)    Chloride 105     CO2 31     Anion Gap 5 (*)    Glucose, Random 118 (*)    Blood Urea Nitrogen (BUN) 13     Creatinine 1.48 (*)    eGFR 42 (*)    Albumin 3.4 (*)    Total Protein 7.1     Bilirubin, Total 0.8     Alkaline Phosphatase (ALP) 89     Aspartate Aminotransferase (AST) 16     Alanine Aminotransferase (ALT) 9     Calcium 8.9     BUN/Creatinine Ratio 8.8 (*)    Corrected Calcium 9.4    CBC WITH DIFFERENTIAL - Abnormal   WBC 9.18     RBC 3.11 (*)     Hemoglobin 11.3 (*)    Hematocrit 32.8 (*)    Mean Corpuscular Volume (MCV) 105.3 (*)    Mean Corpuscular Hemoglobin (MCH) 36.2 (*)    Mean Corpuscular Hemoglobin Conc (MCHC) 34.4     Red Cell Distribution Width (RDW) 15.1     Platelet Count (PLT)       Mean Platelet Volume (MPV)       Neutrophils % 74     Lymphocytes % 16     Monocytes % 8     Eosinophils % 2     Basophils % 0     Neutrophils Absolute 6.80     Lymphocytes # 1.50     Monocytes # 0.70     Eosinophils # 0.20     Basophils # 0.00    BLOOD CULTURE, 2ND SET   Narrative:    Anaerobic bottle not collected. Culture results are for Aerobic bottle only. Electronically signed by: Lucie LITTIE Ehlers 06/09/2024 6:02 AM  URINE CULTURE  BLOOD CULTURE,1ST SET  URINALYSIS WITH REFLEX TO MICROSCOPIC  SARS-COV-2, FLU, AND RSV, QUALITATIVE NAAT  MORPHOLOGY REVIEW   Ovalocytes 2+     Polychromasia 2+     Vacuolated Neutrophils Present       Imaging: CT Head WO Contrast W Quant CT Tiss Character When Performed  Preliminary Result by Vernell Vernard Jetty, MD (11/11 0234)  CT HEAD WITHOUT CONTRAST, 06/09/2024 2:16 AM    INDICATION: Mental status change, unknown cause     COMPARISON: CT head 05/29/2024 and MRI brain 05/02/2022    TECHNIQUE: Axial CT images of the brain from skull base to vertex,   including portions of the face and sinuses, were obtained without   contrast. Supplemental 2D reformatted images were generated and reviewed   as needed.    All CT scans at [facility]   Imaging are performed using radiation dose optimization techniques as   appropriate to a performed exam, including but not limited to one or more   of the following: automatic exposure control, adjustment of the mA and/or   kV according to patient size, use of iterative reconstruction technique.   In addition, our institution participates in a radiation dose monitoring   program to optimize patient  radiation exposure.  FINDINGS:  Calvarium/skull base: No evidence of acute fracture or destructive lesion.   Mastoids and middle ears demonstrate no substantial mucosal disease.   Bilateral pseudophakia.    Paranasal sinuses: No air fluid levels. Trace paranasal sinus mucosal   thickening.    Brain: No acute large vascular territory infarct. Remote bilateral basal   ganglia and right corona radiata lacunar infarcts. No mass effect. No   hydrocephalus. No acute hemorrhage. Global parenchymal volume loss with ex   vacuo ventricular dilatation. Patchy periventricular and subcortical white   matter hypoattenuation, likely sequelae of chronic small vessel disease.   Intracranial atherosclerosis.    IMPRESSION:  No acute intracranial abnormality. However, if there is clinical concern   for acute ischemia, MRI could further evaluate as CT is relatively   insensitive for the detection of an acute infarct in the first 24 to 48   hours.    XR Chest 1 View  Preliminary Result by Vernell Vernard Jetty, MD (11/11 0229)  XR CHEST 1 VIEW, 06/09/2024 2:14 AM    INDICATION:weakness   COMPARISON: Chest x-ray and CT chest 05/29/2024    FINDINGS:     Supportive devices: None  Cardiovascular/lungs/pleura: Similar enlarged cardiomediastinal   silhouette. Diffuse interstitial thickening. Persistent patchy bibasilar   opacities. The lung volumes. No pleural effusion or pneumothorax.  Other: Imaged upper abdomen is unremarkable. Polyarticular degenerative   changes without acute osseous abnormality.    IMPRESSION:  1.  Persistent patchy bibasilar opacities which could reflect sequelae of   aspiration or infection.  2.  Cardiomegaly with possible pulmonary interstitial edema.           ED Course & Medical Decision Making   External records were reviewed:  Discharge summary dated 06/01/24 with diagnosis of AKI.  _________________________  Sherida VEAR Rosella is a 89 y.o. male who was seen in the  ED for AMS.  The following differentials were considered: ICH, cerebral mass/lesion, pneumonia, UTI, dehydration, endocrine or metabolic disturbance.  Pertinent studies were obtained, with results listed in chart above.  Blood work is unremarkable.  Renal function is at baseline.  CT head imaging study is negative.  Chest x-ray does not reveal acute pathology.  Based on ED workup thus far, there are no findings to explain mental status changes.  Urinalysis study is pending at this time.  Patient has not been able to spontaneously void, however had minimal amount of volume on initial bladder scans.  He is currently receiving IV fluids while on PureWick.  If repeat bladder scan shows increased volume, and patient is still unable to void, will reattempt insertion of urinary catheter.  Assuming patient can tolerate oral antibiotics, he might be appropriate for discharge back to [facility] with antibiotic prescription if there is evidence of UTI.  If patient continues to experience gross hematuria, with difficult urinary catheter insertion, he will likely require hospital admission with urology evaluation and CBI.  Reshad's case was discussed with Dr. Waddell, who has agreed to assume care and provide appropriate disposition.  We discussed the history, physical exam findings, completed and pending laboratory results and imaging studies.  We have also discussed the current treatment plan and expected clinical course. Please refer to their chart for Raney's remaining Emergency Department course, final disposition and clinical impression(s).  ED Clinical Impression   1. Dehydration   2. Altered mental status, unspecified altered mental status type   3. Gross hematuria      _________________ Scribe's Attestation: Dr. Zackowski obtained and performed  the history, physical exam and medical decision making elements that were entered into the chart. Documentation assistance was provided by me personally, a  scribe. Signed by Asberry Opal, Scribe on 06/09/2024 6:29 AM  Documentation assistance provided by the scribe. I was present during the time the encounter was recorded. The information recorded by the scribe was done at my direction and has been reviewed and validated by me. William Zackowski, DO  (Please note that portions of this note have been completed with Dragon voice recognition software.  Efforts were made to correct any errors, but occasionally words are mis-transcribed.)

## 2024-06-09 NOTE — H&P (Addendum)
 [facility] Hospitalist History and Physical  Assessment/Plan: 89 years old male with PMH of dementia, stroke, A-fib, HTN, DM, CKD stage III, admitted from SNF on 06/09/2024 with worsening confusion.    Assessment & Plan Acute encephalopathy As per the daughter he is oriented to person and place at baseline.  But she also mentions that he has been more confused in last few weeks.  Currently he is oriented to person only.  Etiology is unclear.  It could be progression of his dementia or due to UTI  CT of the head negative for acute  abnormalities.  TSH is normal.  B12 was normal previously.  UA revealed > 20 RBC,> 50 WBC, 250 leukocyte esterase and negative for nitrite.  He denies any urinary symptoms, difficult to rely on this information given he is a poor historian.   # Acute cystitis with hematuria:- - Follow-up on blood and urine cultures - Continue ceftriaxone  IV daily  # Hematuria :- Urine was brown on UA. Has > 20 RBC's . Urine in the canister looked brown. Could be from UTI or due to eliquis -holding eliquis -monitor CBC   # Hyperkalemia:- - Discontinue KCl 10 mg daily - Follow-up on BMP  # Hypomagnesemia:- - changing magnesium oxide 400 mg daily to twice daily - Follow-up on magnesium level  # Dementia  # Paroxysmal A-fib:- - Decreased the dose of Coreg from 25 to12.5 mg twice daily - holding  Eliquis for hematuria    # CKD stage III:-Baseline creatinine around 1.4.  # History of stroke # Type II DM:-HbA1c was 7.0 on 07/02/2022. # Essential hypertension # Microcytic anemia:-Vitamin B12 and folate normal on 05/30/2024.   Observation  DVT prophylaxis:    pneumatic compression device Anticipated disposition: To Skilled Nursing Facility Estimated discharge:    2-3 days  CODE status: Patient is DNR Limited Scope of Treatment. This was discussed on the day of admission.  ___________________________________________________________________  Chief Complaint: Chief  Complaint  Patient presents with  . Altered Mental Status    HPI: 89 years old male with PMH of dementia, stroke, HTN, DM, CKD stage III, admitted from SNF on 06/09/2024 with worsening confusion.  He was recently in this hospital from 10/31 to 11/3 for possible pneumonia.  He was treated with ceftriaxone  and azithromycin and discharged on cefdinir x 1 dose.  Patient is a poor historian secondary dementia.  He is alert oriented to person only.  History is obtained from ER provider's documentation and talking with his daughter on the phone.  Patient's daughter mentions that he was crying out stating he is in a lot of pain so he was sent to the ER  Patient does not know why he was sent to the ER.  He denies urinary symptoms.  In the ER he is afebrile, tachycardic and tachypneic.  WBC count is normal.  UA revealed > 20 RBC,> 50 WBC, 250 leukocyte esterase and negative for nitrite. CXR revealed persistent patchy bibasilar opacities.  CT of the head did not have any acute intracranial abnormality.  He is treated with IV fluids, ceftriaxone  IV.  He is admitted for further management.     Allergies: Codeine  Medications:    Medical History: Medical History[1]  Surgical History: Surgical History[2]  Social History: Social History   Socioeconomic History  . Marital status: Widowed    Spouse name: Not on file  . Number of children: Not on file  . Years of education: Not on file  . Highest education  level: Not on file  Occupational History  . Not on file  Tobacco Use  . Smoking status: Not on file  . Smokeless tobacco: Not on file  Substance and Sexual Activity  . Alcohol use: Not on file  . Drug use: Not on file  . Sexual activity: Not on file  Other Topics Concern  . Not on file  Social History Narrative  . Not on file   Social Drivers of Health   Food Insecurity: Low Risk  (07/02/2022)   Received from [facility] visits prior to 09/29/2022.   Food    . Within the past 12 months, you worried that your food would run out before you got money to buy more food: Never true   . Within the past 12 months, the food you bought just didn't last and you didn't have money to get more: Never true  Transportation Needs: No Transportation Needs (07/02/2022)   Received from [facility] visits prior to 09/29/2022.   Transportation   . In the past 12 months, has lack of reliable transportation kept you from medical appointments, meetings, work or from getting things needed for daily living?: No  Safety: Not At Risk (12/14/2023)   Received from [facility]   HITS   . Over the last 12 months how often did your partner scream or curse at you?: Never   . Over the last 12 months how often did your partner physically hurt you?: Never   . Over the last 12 months how often did your partner insult you or talk down to you?: Never   . Over the last 12 months how often did your partner threaten you with physical harm?: Never  Living Situation: Low Risk  (07/02/2022)   Received from [facility] visits prior to 09/29/2022.   Living Needs   . What is your living situation today?: I have a steady place to live   . Think about the place you live.  Do you have problems with any of the following? (Choose all that apply): None of the above    Family History: Family History[3]  Review of Systems: A complete review of organ systems is negative unless otherwise mentioned in HPI  Labs/Studies: Labs and Studies from the last 24hrs per EMR and Personally Reviewed by me  Physical Exam: Temp:  [99 F (37.2 C)] 99 F (37.2 C) Heart Rate:  [76-93] 93 Resp:  [18-23] 20 BP: (139-152)/(84-109) 145/99   GEN: NAD, lying in bed EYES: EOMI ENT: MMM CV: RRR, no murmurs appreciated PULM: CTA B ABD: soft, NT/ND, +BS EXT: Trace pitting edema in bilateral lower extremities. NEURO: No focal deficits PSYCH: A+ O oriented to person.  Able to  identify colors, count fingers appropriately.         [1] Past Medical History: Diagnosis Date  . BPH (benign prostatic hyperplasia) 01/06/2015  . Cerebrovascular disease 06/05/2021  . Essential hypertension 01/06/2015  . Peripheral vascular disease 01/06/2015  [2] History reviewed. No pertinent surgical history. [3] No family history on file.

## 2024-06-09 NOTE — H&P (Signed)
 HOSPITAL MEDICINE - ADMISSION NOTE STUDENT NOTE Primary Care Physician PCP Needed PPI  Primary Contact Extended Emergency Contact Information Primary Emergency Contact: Oren,[name] Mobile Phone: [phone] Relation: Daughter   Chief Complaint Altered Mental Status, Hematuria  History of Present Illness   Johnny Zimmerman is a 89 y.o. male with history of PMH significant for dementia, CVA (baseline slurred speech), HTN, afib on Eliquis, CKD III, DM, osteoarthritis, who presents to the ED via EMS from [facility] with report of worsening altered mental status and generalized pain. Family reports he was crying out in pain overnight. Additionally they report he is more confused from baseline. Pt was recently discharged from this hospital on 06/01/24 for pneumonia, discharged with cefdinir, unknown if he finished it. Chart review shows ceftriaxone  and azithromycin in hospital. Urine cultures were negative at that time. Did have AKI on CKD.  ED course: pt HDS, mild fever of 99, UA positive for UTI, urine and blood cultures collected. Cr of 1.48, 8 days ago on discharge was 1.36 from 1.96 when admitted previously. No leukocytosis. Mg low at 1.7, TSH WNL. CXR shows cardiomegaly, patchy bibasilar opacities; aspiration vs infection vs pulmonary edema. CT head WO contrast showed no acute intracranial abnormality. EKG shows no significant changes since last EKG; afib w/ PVCs, left anterior fascicular block and RBBB. Negative for flu/covid/RSV. 1g Ceftriaxone  given.  Pt is poor historian, but does have worsening confusion per family. Difficult to ascertain if patient has pain or not, would report and then deny pain with urination, suprapubic pain. Denied flank/back pain, fever/chills. Does report mild dyspnea while laying flat.   Admitted from SNF on 11/11 for UTI, altered mental status.  Review of Systems 10 point review of negative unless noted in HPI   PMFSH and Home Medications   Past  Medical History  Medical History[1]  Surgical History Surgical History[2]   Family History Family History[3]  Social History Social History[4]   Allergies Codeine  Medications Prescriptions Prior to Admission[5]   Objective   Temp:  [99 F (37.2 C)] 99 F (37.2 C) Heart Rate:  [76-93] 93 Resp:  [18-23] 20 BP: (139-152)/(84-109) 145/99  SpO2: [92 - 99] 99%, on room air.   Physical Exam Constitutional: No acute distress, found laying in bed asleep, awoken easily. Eyes: anicteric, tracks examiner across the room without restrictions in extraocular range of motion HENT: normocephalic, atraumatic, hard of hearing, poor dentition Cardiovascular: Irregularly, irregular rhythm. No murmurs, rubs, or gallops. 2+ pitting edema of right lower extremity, no edema of left lower extremity. Respiratory: short of breath laying flat, symmetric chest rise, lung sounds clear and equal bilaterally. Gastrointestinal: symmetric, non-distended, suprapubic area tender to palpation GU: external catheter in place, dried blood noted around scrotum and inner thighs Musculoskeletal: moves all extremities spontaneously Neurological: Alert and Oriented X 1, cranial nerves grossly intact Psychological: appropriate mood with congruent affect Skin: no overt rashes or wounds noted. Minor bruising on extremities.  Results from last 7 days  Lab Units 06/09/24 0208  WHITE BLOOD CELL COUNT 10*3/uL 9.18  HEMOGLOBIN g/dL 88.6*  HEMATOCRIT % 67.1*    Results from last 7 days  Lab Units 06/09/24 0208  SODIUM mmol/L 141  POTASSIUM mmol/L 4.7*  CHLORIDE mmol/L 105  CO2 mmol/L 31  BUN mg/dL 13  CREATININE mg/dL 8.51*  GLUCOSE mg/dL 881*  CALCIUM mg/dL 8.9    Results from last 7 days  Lab Units 06/09/24 0208  AST U/L 16  ALT U/L 9  BILIRUBIN TOTAL  mg/dL 0.8  TOTAL PROTEIN g/dL 7.1  ALBUMIN g/dL 3.4*  ALP U/L 89   Results from last 7 days  Lab Units 06/09/24 0208  MAGNESIUM mg/dL 1.7*     Results from last 7 days  Lab Units 06/09/24 0208  MAGNESIUM mg/dL 1.7*    No results found for this visit on 06/09/24 (from the past week).   XR Chest 1 View Result Date: 06/09/2024 1.  Persistent patchy bibasilar opacities which could reflect sequelae of aspiration or infection. 2.  Cardiomegaly with possible pulmonary interstitial edema.   CT Head WO Contrast W Quant CT Tiss Character When Performed Result Date: 06/09/2024 No acute intracranial abnormality. However, if there is clinical concern for acute ischemia, MRI could further evaluate as CT is relatively insensitive for the detection of an acute infarct in the first 24 to 48 hours.   Assessment and Plan   Assessment & Plan  #Acute encephalopathy #UTI Complaints of hematuria, AMS, mild fever of 54F. UA with evidence for UTI, urine cultures collected in ED. UA positive for  Pt denies infectious symptoms, no flank/back pain, no fever/chills. No focal neuro deficits at this time, A&Ox1 to name. Suspect encephalopathy secondary to acute UTI, however, due to pt's dementia and hx of CVA, no certain etiology.  - f/u on urine and blood cultures - Continue Ceftriaxone  - Monitor neuro status  #CKD Cr of 1.48 on admission, baseline per chart review seems to be around 1.4.  #Afib #Hypomagnesemia #Hyperkalemia - Continue home Eliquis - Continue home Coreg - Maintain k of 4 and mg of 2  - Replete as needed  - Continue home magnesium supplement  - Do not continue home potassium  - Monitor BMP - Continuous tele  Chronic medical conditions: #HTN: Coreg as above #CVA hx: atorvastatin #Macrocytic anemia: recent admission showed normal B12 and folate. Transfuse for hb <7. Pt does take b12 supplement at home. #Dementia #T2DM: pt does not appear to be on any home medications, recent BS of 118   DVT Prophylaxis: Eliquis  CODE STATUS: DNAR. Information obtained from advanced directives  Could [facility] be considered for  Hospital at Home in the next 24 hours? No  Anticipate > 2 midnight stay, patient will be admitted as inpatient for Acute encephalopathyEstimated date of discharge Jun 11, 2024    Jinnie JINNY Majestic, [place] Student         [1] Past Medical History: Diagnosis Date  . BPH (benign prostatic hyperplasia) 01/06/2015  . Cerebrovascular disease 06/05/2021  . Essential hypertension 01/06/2015  . Peripheral vascular disease 01/06/2015  [2] History reviewed. No pertinent surgical history. [3] No family history on file. [4] Social History Socioeconomic History  . Marital status: Widowed   Social Drivers of Health   Food Insecurity: Low Risk  (07/02/2022)   Received from [facility] visits prior to 09/29/2022.   Food   . Within the past 12 months, you worried that your food would run out before you got money to buy more food: Never true   . Within the past 12 months, the food you bought just didn't last and you didn't have money to get more: Never true  Transportation Needs: No Transportation Needs (07/02/2022)   Received from [facility] visits prior to 09/29/2022.   Transportation   . In the past 12 months, has lack of reliable transportation kept you from medical appointments, meetings, work or from getting things needed for daily living?: No  Safety: Not At Risk (12/14/2023)   Received from [facility]   HITS   . Over the last 12 months how often did your partner scream or curse at you?: Never   . Over the last 12 months how often did your partner physically hurt you?: Never   . Over the last 12 months how often did your partner insult you or talk down to you?: Never   . Over the last 12 months how often did your partner threaten you with physical harm?: Never  Living Situation: Low Risk  (07/02/2022)   Received from [facility] visits prior to 09/29/2022.   Living Needs   . What is your living situation today?: I have a steady  place to live   . Think about the place you live.  Do you have problems with any of the following? (Choose all that apply): None of the above  [5] (Not in a hospital admission)

## 2024-06-09 NOTE — ED Provider Notes (Signed)
 Medical Decision Making: Care of patient assumed from Dr. Zackowski at 0600. See their note for further details of history, physical exam and plan.  Briefly, 89 y.o. male with PMH/PSH as below.  Medical History[1] Surgical History[2]   Patient awaiting urinalysis and fluids and hydration to see if his mental status improves  Plan at time of handoff: Discharge if patient gets better and the hematuria clears or admit if patient has UTI and is still altered mental status.  ED Course ED Course as of 06/09/24 1115  Tue Jun 09, 2024  1016 Patient is still having hematuria at this time, awaiting urinalysis results. [LT]  1112 Patient's urinalysis showed 250 leukocytes greater than 50 WBCs and quadrant 20 RBCs per high-power field. [LT]  1112 Patient is still somewhat altered in the ER as well as he is still having hematuria so we will go to go ahead and have him admitted to the hospitalist service for IV antibiotics and treatment for his altered mental status and UTI. [LT]  1113 Disposition-admit. [LT]    ED Course User Index [LT] Rock Dannielle Birmingham, MD     Clinical Impression 1. Dehydration   2. Altered mental status, unspecified altered mental status type   3. Gross hematuria   4. Acute UTI     ED Disposition     ED Disposition  Admit   Condition  --   Comment  --          Electronically signed by: Rock Birmingham, MD 06/09/2024 11:15 AM    This document serves as a record of services personally performed by Rock Birmingham, MD. It was created on their behalf by Karena LOISE Hurst, Scribe, a trained medical scribe. The creation of this record is the provider's dictation and/or activities during the visit.   Electronically signed by: Karena LOISE Hurst, Scribe 06/09/2024 11:15 AM       [1] Past Medical History: Diagnosis Date  . BPH (benign prostatic hyperplasia) 01/06/2015  . Cerebrovascular disease 06/05/2021  . Essential hypertension 01/06/2015  . Peripheral vascular disease  01/06/2015  [2] History reviewed. No pertinent surgical history.

## 2024-06-10 NOTE — Progress Notes (Signed)
 Hospitalist Daily Progress Note     Date: 06/10/2024        Room: 655/01 Hospital Day: 0 Attending Physician: [name], MD  Subjective / Interval History / ROS  No new issues reported.   Assessment and Plan    Principal Problem:   Acute encephalopathy Resolved Problems:   * No resolved hospital problems. *  # Acute cystitis with hematuria:- - Follow-up on blood and urine cultures - Continue ceftriaxone  IV daily - Obtain renal ultrasound    # Hematuria :- Urine was brown on UA. Has > 20 RBC's . Urine in the canister looked brown. Could be from UTI or due to eliquis -holding eliquis -monitor CBC    # Hyperkalemia:- - Discontinued KCl 10 mg daily - Follow-up on BMP   # Hypomagnesemia:- -Monitor closely and replace as needed   # Dementia -Continue supportive care/current regimen.   # Paroxysmal A-fib:- - Decreased the dose of Coreg from 25 to12.5 mg twice daily - holding  Eliquis for hematuria      # CKD stage III:-Baseline creatinine around 1.4.  Obtain renal ultrasound.   # History of stroke # Type II DM:-HbA1c was 7.0 on 07/02/2022. # Essential hypertension # Microcytic anemia:-Vitamin B12 and folate normal on 05/30/2024. - Continue home regimen.  The primary contact is:  Extended Emergency Contact Information Primary Emergency Contact: [name],[name] Mobile Phone: [phone] Relation: Daughter Secondary Emergency Contact: [name],[name] Mobile Phone: [phone] Relation: Son   Code Status: DNAR / Limited SOTO Code status last reviewed with patient and/or updated 06/10/2024   DVT ppx: Sequential Compression Devices  Current Diet:     Dietary Orders  (From admission, onward)               Ordered    Adult Diet- Regular  Diet effective now       References:    Medical Nutrition Management (MNM) for Registered Dietitian  Question Answer Comment  Diet type: Regular   Medical Nutrition Management By RD  Yes, Medical Nutrition Management By RD      06/09/24 1232             Discharge Planning  The current disposition plan is discharge to TBD in 2-3 days. Barriers to discharge: pending medical stabilization Discharge follow up needed:     Objective   Temp:  [97.3 F (36.3 C)-98 F (36.7 C)] 97.3 F (36.3 C) Heart Rate:  [65-141] 65 Resp:  [18-39] 22 BP: (132-156)/(80-131) 132/84   Intake/Output Summary (Last 24 hours) at 06/10/2024 1410 Last data filed at 06/10/2024 0422 Gross per 24 hour  Intake --  Output 100 ml  Net -100 ml      Physical Examination: GEN: NAD, lying in bed EYES: EOMI ENT: MMM CV: RRR PULM: Fair bilateral air entry noted ABD: Soft, non-distended, non-tender, normal bowel sounds Psychiatric exam.  Patient has appropriate behavior.    Labs and Results  I have reviewed the following labs and studies Lab Results (last 24 hours)     Procedure Component Value Ref Range Date/Time   MRSA Screen by PCR [[id number]] Collected: 06/10/24 0932   Lab Status: In process Specimen: Swab from Nares Updated: 06/10/24 0959   Basic Metabolic Panel [[id number]]  (Abnormal) Collected: 06/10/24 0104   Lab Status: Final result Specimen: Blood from Venous Updated: 06/10/24 0159    Sodium 141 136 - 145 mmol/L     Potassium 5.0* 3.4 - 4.5 mmol/L     Chloride 106 98 - 107 mmol/L  CO2 27 21 - 31 mmol/L     Comment: High lactate dehydrogenase (LDH) concentrations in patient samples may cause falsely increased bicarbonate results. If markedly elevated LDH levels are suspected, please assess results in conjunction with patient's LDH values.      Anion Gap 8 6 - 14 mmol/L     Glucose, Random 96 70 - 99 mg/dL     Blood Urea Nitrogen (BUN) 16 7 - 25 mg/dL     Creatinine 8.57* 9.29 - 1.30 mg/dL     eGFR 45* >40 fO/fpw/8.26f7     Comment: GFR estimated by CKD-EPI equations(NKF 2021).   Recommend confirmation of Cr-based eGFR by using Cys-based eGFR and other  filtration markers (if applicable) in complex cases and clinical decision-making, as needed.      Calcium 8.8 8.6 - 10.3 mg/dL     BUN/Creatinine Ratio 11.3 10.0 - 20.0    Magnesium [[id number]]  (Abnormal) Collected: 06/10/24 0104   Lab Status: Final result Specimen: Blood from Venous Updated: 06/10/24 0159    Magnesium 1.6* 1.9 - 2.7 mg/dL    CBC without Differential [[id number]]  (Abnormal) Collected: 06/10/24 0104   Lab Status: Final result Specimen: Blood from Venous Updated: 06/10/24 0136   Narrative:     The following orders were created for panel order CBC without Differential. Procedure                               Abnormality         Status                    ---------                               -----------         ------                    CBC without Differential[[id number]]    Abnormal            Final result               Please view results for these tests on the individual orders.   CBC without Differential [[id number]]  (Abnormal) Collected: 06/10/24 0104   Lab Status: Final result Specimen: Blood from Venous Updated: 06/10/24 0136    WBC 7.98 4.40 - 11.00 10*3/uL     RBC 3.17* 4.50 - 5.90 10*6/uL     Hemoglobin 11.3* 14.0 - 17.5 g/dL     Hematocrit 66.3* 58.4 - 50.4 %     Mean Corpuscular Volume (MCV) 106.2* 80.0 - 96.0 fL     Mean Corpuscular Hemoglobin (MCH) 35.6* 27.5 - 33.2 pg     Mean Corpuscular Hemoglobin Conc (MCHC) 33.6 33.0 - 37.0 g/dL     Red Cell Distribution Width (RDW) 15.3 12.3 - 17.0 %     Platelet Count (PLT) 199 150 - 450 10*3/uL     Mean Platelet Volume (MPV) 8.6 6.8 - 10.2 fL       Results for orders placed or performed during the hospital encounter of 06/09/24 (from the past week)  Blood Culture, 1st Set   Specimen: Venous; Blood  Result Value Ref Range   Blood Culture No growth after 1 day.   Blood Culture, 2nd Set   Specimen: Venous; Blood  Result Value Ref Range   Blood Culture  No growth after 1 day.       Medications    allopurinoL, 100 mg, oral, Daily [Held by provider] apixaban, 5 mg, oral, BID carvediloL, 12.5 mg, oral, BID cefTRIAXone , 2 g, intravenous, Q24H cholecalciferol (VITAMIN D3), 1,000 Units, oral, Daily famotidine, 20 mg, oral, Nightly finasteride, 5 mg, oral, Daily gabapentin, 100 mg, oral, BID magnesium oxide, 400 mg, oral, BID omeprazole, 20 mg, oral, Daily 0600 tamsulosin, 0.4 mg, oral, Nightly   .  acetaminophen  .  dextrose .  dextrose .  ondansetron  .  ondansetron     Electronically signed by: [name], MD 06/10/2024 2:10 PM      *Some images could not be shown.

## 2024-06-11 NOTE — Progress Notes (Signed)
 Hospitalist Daily Progress Note     Date: 06/11/2024        Room: 655/01 Hospital Day: 1 Attending Physician: [name], MD  Subjective / Interval History / ROS  No new issues reported.   Assessment and Plan    Principal Problem:   Acute encephalopathy Resolved Problems:   * No resolved hospital problems. *  # Acute cystitis with hematuria:- - Urine culture shows no uropathogen.  Blood culture pending, so far no growth. - Continue ceftriaxone  IV daily - Obtain renal ultrasound    # Hematuria :- Urine was brown on UA. Has > 20 RBC's . Urine in the canister looked brown. Could be from UTI or due to eliquis -holding eliquis---hemoglobin stable.  Will resume Eliquis and monitor CBC closely.  If any more episodes of hematuria reported then consider holding Eliquis. -monitor CBC    # Hyperkalemia, improving:- - Discontinued KCl 10 mg daily - Follow-up on BMP   # Hypomagnesemia:- -Monitor closely and replace as needed   # Dementia -Continue supportive care/current regimen.   # Paroxysmal A-fib:- - Decreased the dose of Coreg from 25 to12.5 mg twice daily - holding  Eliquis for hematuria--Will resume Eliquis from tonight.     # CKD stage III:-Baseline creatinine around 1.4.  Obtain renal ultrasound.   # History of stroke # Type II DM:-HbA1c was 7.0 on 07/02/2022. # Essential hypertension # Microcytic anemia:-Vitamin B12 and folate normal on 05/30/2024. - Continue home regimen.   The primary contact is:  Extended Emergency Contact Information Primary Emergency Contact: Leeson,[name] Mobile Phone: [phone] Relation: Daughter Secondary Emergency Contact: Rollins,[name] Mobile Phone: [phone] Relation: Son   Code Status: DNAR / Limited SOTO Code status last reviewed with patient and/or updated 06/11/2024   DVT ppx: eliquis  Current Diet:     Dietary Orders  (From admission, onward)               Ordered     Adult Diet- Regular  Diet effective now       References:    Medical Nutrition Management (MNM) for Registered Dietitian  Question Answer Comment  Diet type: Regular   Medical Nutrition Management By RD Yes, Medical Nutrition Management By RD      06/09/24 1232             Discharge Planning  The current disposition plan is discharge to TBD in 24 hours. Barriers to discharge: pending medical stabilization Discharge follow up needed:     Objective   Temp:  [97.3 F (36.3 C)-98.2 F (36.8 C)] 97.7 F (36.5 C) Heart Rate:  [69-101] 69 Resp:  [18-24] 24 BP: (116-139)/(60-88) 116/65   Intake/Output Summary (Last 24 hours) at 06/11/2024 1359 Last data filed at 06/11/2024 9092 Gross per 24 hour  Intake 150 ml  Output 175 ml  Net -25 ml      Physical Examination: GEN: NAD, lying in bed EYES: EOMI ENT: MMM CV: RRR PULM: Fair bilateral air entry noted ABD: Soft, non-distended, non-tender, normal bowel sounds Psychiatric exam.  Patient has appropriate behavior.    Labs and Results  I have reviewed the following labs and studies Lab Results (last 24 hours)     Procedure Component Value Ref Range Date/Time   CBC without Differential [[id number]]  (Abnormal) Collected: 06/11/24 0135   Lab Status: Final result Specimen: Blood from Venous Updated: 06/11/24 0203   Narrative:     The following orders were created for panel order CBC without Differential. Procedure  Abnormality         Status                    ---------                               -----------         ------                    CBC without [id number]]    Abnormal            Final result               Please view results for these tests on the individual orders.   Basic Metabolic Panel [[id number]]  (Abnormal) Collected: 06/11/24 0135   Lab Status: Final result Specimen: Blood from Venous Updated: 06/11/24 0208    Sodium 139 136 - 145 mmol/L     Potassium 4.7*  3.4 - 4.5 mmol/L     Chloride 104 98 - 107 mmol/L     CO2 26 21 - 31 mmol/L     Comment: High lactate dehydrogenase (LDH) concentrations in patient samples may cause falsely increased bicarbonate results. If markedly elevated LDH levels are suspected, please assess results in conjunction with patient's LDH values.      Anion Gap 9 6 - 14 mmol/L     Glucose, Random 117* 70 - 99 mg/dL     Blood Urea Nitrogen (BUN) 20 7 - 25 mg/dL     Creatinine 8.56* 9.29 - 1.30 mg/dL     eGFR 44* >40 fO/fpw/8.26f7     Comment: GFR estimated by CKD-EPI equations(NKF 2021).   Recommend confirmation of Cr-based eGFR by using Cys-based eGFR and other filtration markers (if applicable) in complex cases and clinical decision-making, as needed.      Calcium 8.5* 8.6 - 10.3 mg/dL     BUN/Creatinine Ratio 14.0 10.0 - 20.0    Magnesium [[id number]]  (Normal) Collected: 06/11/24 0135   Lab Status: Final result Specimen: Blood from Venous Updated: 06/11/24 0208    Magnesium 1.9 1.9 - 2.7 mg/dL    CBC without Differential [[id number]]  (Abnormal) Collected: 06/11/24 0135   Lab Status: Final result Specimen: Blood from Venous Updated: 06/11/24 0203    WBC 8.58 4.40 - 11.00 10*3/uL     RBC 3.22* 4.50 - 5.90 10*6/uL     Hemoglobin 11.6* 14.0 - 17.5 g/dL     Hematocrit 66.1* 58.4 - 50.4 %     Mean Corpuscular Volume (MCV) 105.0* 80.0 - 96.0 fL     Mean Corpuscular Hemoglobin (MCH) 36.2* 27.5 - 33.2 pg     Mean Corpuscular Hemoglobin Conc (MCHC) 34.5 33.0 - 37.0 g/dL     Red Cell Distribution Width (RDW) 14.7 12.3 - 17.0 %     Platelet Count (PLT) 182 150 - 450 10*3/uL     Mean Platelet Volume (MPV) 9.0 6.8 - 10.2 fL    Vitamin D, 25-Hydroxy [[id number]] Collected: 06/11/24 0135   Lab Status: In process Specimen: Blood from Venous Updated: 06/11/24 0916      Results for orders placed or performed during the hospital encounter of 06/09/24 (from the past week)  Blood Culture, 1st Set   Specimen: Venous; Blood   Result Value Ref Range   Blood Culture No growth after 2 days.   Blood Culture, 2nd Set   Specimen: Venous; Blood  Result  Value Ref Range   Blood Culture No growth after 2 days.   Urine Culture   Specimen: Clean Catch; Urine  Result Value Ref Range   Urine Culture No uropathogens isolated.       Medications   allopurinoL, 100 mg, oral, Daily [Held by provider] apixaban, 5 mg, oral, BID carvediloL, 12.5 mg, oral, BID cefTRIAXone , 2 g, intravenous, Q24H cholecalciferol (VITAMIN D3), 1,000 Units, oral, Daily famotidine, 20 mg, oral, Nightly finasteride, 5 mg, oral, Daily gabapentin, 100 mg, oral, BID magnesium oxide, 400 mg, oral, BID mupirocin, 1 Application, Each Nostril, BID omeprazole, 20 mg, oral, Daily 0600 tamsulosin, 0.4 mg, oral, Nightly   .  acetaminophen  .  dextrose .  dextrose .  ondansetron  .  ondansetron     Electronically signed by: [name], MD 06/11/2024 1:59 PM      *Some images could not be shown.

## 2024-06-12 NOTE — Progress Notes (Signed)
    PATIENTJAXSUN, CIAMPI #: [id number]  MRN:  [id number] PROVIDER Name:        [name] MD     PHYSICIAN RESPONSE: Unable to determine DOCUMENTATION CLARIFICATION:  Please render your professional opinion if you are treating and/or monitoring:  - Metabolic Encephalopathy, POA - Other (please specify) _____________________   Signs/Symptoms: H/P- Patient presents with worsening confusion.  Assessment & Plan: # Acute encephalopathy As per the daughter he is oriented to person and place at baseline.  But she also mentions that he has been more confused in last few weeks.  Currently he is oriented to person only.  Etiology is unclear.  It could be progression of his dementia or due to UTI. # Acute cystitis with hematuria # Dementia  11/11 CT Head- No acute intracranial abnormality.  GCS per nursing documentation: 11/11 at 0152: 14 (4/4/6) 11/13 at 1204: 14 (4/4/6)  11/11 Urinalysis: Color: Delores Clarity, Urine: Ex. Turbid Specific Gravity, Urine: 1.028 Protein, Urine: 100 Blood, Urine: 3+ Leukocytes Esterase, Urine: 250 WBC, Urine: >50 RBC, Urine: >20 Bacteria, Urine: Occasional  11/11 Urine Culture- No uropathogens isolated  Treatment/Evaluation: IV Ceftriaxone , UA, Urine Cultures, Neurological assessments, vital signs, lab monitoring, CT Head.  Risk Factors: 89 year old with a history of dementia presents with acute encephalopathy and acute cystitis with hematuria.   Mallorie Jessup, [employer]ITY [employer]DR[employer], Theme Park [email or web address], available via secure chat    The patient's Clinical Indicators include: See above.  MRN: [id number]   Clarification initiated by: Willo Mings on 06/11/2024 2:53 PM  Disclaimer: The purpose of this clarification is to ensure the accuracy and integrity of the documentation, and resultant codes, where this is conflicting, ambiguous or incomplete information, or clinical evidence for a higher degree of specificity or  severity. In addition to clinical responses provided, providers are also presented with free-text and unable to determine response options.    Electronically signed by:  [name] MD 06/12/2024 8:16 AM

## 2024-06-12 NOTE — Progress Notes (Signed)
 Hospitalist Daily Progress Note     Date: 06/12/2024        Room: 655/01 Hospital Day: 2 Attending Physician: [name], MD  Subjective / Interval History / ROS  Patient's speech is not clear.  No other new issues reported.   Assessment and Plan    Principal Problem:   Acute encephalopathy Active Problems:   Impaired functional mobility, balance, gait, and endurance   Impaired mobility and ADLs Resolved Problems:   * No resolved hospital problems. *  # Presumed acute cystitis with hematuria:- - Urine culture shows no uropathogen.  Blood culture pending, so far no growth. - Completing ceftriaxone  IV daily for 3 days - Reviewed renal ultrasound    # Hematuria :- Urine was brown on UA. Has > 20 RBC's . Urine in the canister looked brown. Could be from UTI or due to eliquis - We have been holding eliquis---hemoglobin stable.  Resumed Eliquis last night.  Plan is to monitor CBC closely.  If any more episodes of hematuria reported then consider holding Eliquis consider urology consult.  Renal ultrasound reviewed. -monitor CBC    # Hyperkalemia, improving:- - Discontinued KCl 10 mg daily - Follow-up on BMP   # Hypomagnesemia:- -Monitor closely and replace as needed   # Dementia -Continue supportive care/current regimen.   # Paroxysmal A-fib:- - Decreased the dose of Coreg from 25 to12.5 mg twice daily - holding  Eliquis for hematuria--resumed Eliquis from last night.     # CKD stage III:-Baseline creatinine around 1.4.  Obtain renal ultrasound.   # History of stroke # Type II DM:-HbA1c was 7.0 on 07/02/2022. # Essential hypertension # Microcytic anemia:-Vitamin B12 and folate normal on 05/30/2024. - Continue home regimen.   # Questionable acute encephalopathy, unsure about the cause as urinalysis suggestive of UTI and urine culture no uropathogen.  MRI brain negative for acute pathology.  Could be due to dementia.  # Presumed  dysphagia/high risk for aspiration/speech problem.  Speech pathology following the patient closely, help appreciated.  Patient has been NPO.  Patient is on the schedule for MBS today and go from there.  Deconditioning/weakness.  PT/OT recommended skilled nursing facility.  Likely discharge over the weekend if pending workup negative and no new issues.  The primary contact is:  Extended Emergency Contact Information Primary Emergency Contact: Hammontree,[name] Mobile Phone: [phone] Relation: Daughter Secondary Emergency Contact: Pinnix,[name] Mobile Phone: [phone] Relation: Son   Code Status: DNAR / Limited SOTO Code status last reviewed with patient and/or updated 06/12/2024   DVT ppx: eliquis  Current Diet:     Dietary Orders  (From admission, onward)               Ordered    Adult Diet- Dysphagia; Mildly thick liquids (Level 2); Minced and moist foods (Level 5)  Diet effective now       References:    Medical Nutrition Management (MNM) for Registered Dietitian  Question Answer Comment  Diet type: Dysphagia   Fluid Modification: Mildly thick liquids (Level 2)   Food Modification: Minced and moist foods (Level 5)   Medical Nutrition Management By RD Yes, Medical Nutrition Management By RD      06/12/24 1204             Discharge Planning  The current disposition plan is discharge to SNF in 24 hours. Barriers to discharge: pending medical stabilization Discharge follow up needed:     Objective   Temp:  [97.4 F (36.3 C)-98 F (  36.7 C)] 97.4 F (36.3 C) Heart Rate:  [69-97] 97 Resp:  [20-24] 20 BP: (116-137)/(62-79) 137/78   Intake/Output Summary (Last 24 hours) at 06/12/2024 1332 Last data filed at 06/12/2024 0432 Gross per 24 hour  Intake 1234.17 ml  Output 250 ml  Net 984.17 ml      Physical Examination: GEN: NAD, lying in bed EYES: EOMI ENT: Dry mucous membranes noted CV: RRR PULM: Fair bilateral air entry noted ABD: Soft, non-distended,  non-tender, normal bowel sounds Psychiatric exam.  Patient has appropriate behavior.  Unable to understand his voice but overall not agitated/combative.    Labs and Results  I have reviewed the following labs and studies Lab Results (last 24 hours)     Procedure Component Value Ref Range Date/Time   CBC without Differential [[id number]]  (Abnormal) Collected: 06/12/24 0105   Lab Status: Final result Specimen: Blood from Venous Updated: 06/12/24 0120   Narrative:     The following orders were created for panel order CBC without Differential. Procedure                               Abnormality         Status                    ---------                               -----------         ------                    CBC without Differential[[id number]]    Abnormal            Final result               Please view results for these tests on the individual orders.   Basic Metabolic Panel [[id number]]  (Abnormal) Collected: 06/12/24 0105   Lab Status: Final result Specimen: Blood from Venous Updated: 06/12/24 0147    Sodium 139 136 - 145 mmol/L     Potassium 4.6* 3.4 - 4.5 mmol/L     Chloride 105 98 - 107 mmol/L     CO2 28 21 - 31 mmol/L     Comment: High lactate dehydrogenase (LDH) concentrations in patient samples may cause falsely increased bicarbonate results. If markedly elevated LDH levels are suspected, please assess results in conjunction with patient's LDH values.      Anion Gap 6 6 - 14 mmol/L     Glucose, Random 105* 70 - 99 mg/dL     Blood Urea Nitrogen (BUN) 20 7 - 25 mg/dL     Creatinine 8.52* 9.29 - 1.30 mg/dL     eGFR 43* >40 fO/fpw/8.26f7     Comment: GFR estimated by CKD-EPI equations(NKF 2021).   Recommend confirmation of Cr-based eGFR by using Cys-based eGFR and other filtration markers (if applicable) in complex cases and clinical decision-making, as needed.      Calcium 8.2* 8.6 - 10.3 mg/dL     BUN/Creatinine Ratio 13.6 10.0 - 20.0    Magnesium [[id number]]   (Normal) Collected: 06/12/24 0105   Lab Status: Final result Specimen: Blood from Venous Updated: 06/12/24 0147    Magnesium 1.9 1.9 - 2.7 mg/dL    CBC without Differential [[id number]]  (Abnormal) Collected: 06/12/24 0105   Lab Status: Final result Specimen: Blood  from Venous Updated: 06/12/24 0120    WBC 7.85 4.40 - 11.00 10*3/uL     RBC 3.09* 4.50 - 5.90 10*6/uL     Hemoglobin 11.2* 14.0 - 17.5 g/dL     Hematocrit 67.2* 58.4 - 50.4 %     Mean Corpuscular Volume (MCV) 105.9* 80.0 - 96.0 fL     Mean Corpuscular Hemoglobin (MCH) 36.3* 27.5 - 33.2 pg     Mean Corpuscular Hemoglobin Conc (MCHC) 34.3 33.0 - 37.0 g/dL     Red Cell Distribution Width (RDW) 15.3 12.3 - 17.0 %     Platelet Count (PLT) 184 150 - 450 10*3/uL     Mean Platelet Volume (MPV) 8.6 6.8 - 10.2 fL       Results for orders placed or performed during the hospital encounter of 06/09/24 (from the past week)  Blood Culture, 1st Set   Specimen: Venous; Blood  Result Value Ref Range   Blood Culture No growth after 3 days.   Blood Culture, 2nd Set   Specimen: Venous; Blood  Result Value Ref Range   Blood Culture No growth after 3 days.   Urine Culture   Specimen: Clean Catch; Urine  Result Value Ref Range   Urine Culture No uropathogens isolated.       Medications   allopurinoL, 100 mg, oral, Daily apixaban, 5 mg, oral, BID carvediloL, 12.5 mg, oral, BID cholecalciferol (VITAMIN D3), 1,000 Units, oral, Daily famotidine, 20 mg, oral, Nightly finasteride, 5 mg, oral, Daily gabapentin, 100 mg, oral, BID magnesium oxide, 400 mg, oral, BID mupirocin, 1 Application, Each Nostril, BID omeprazole, 20 mg, oral, Daily 0600 tamsulosin, 0.4 mg, oral, Nightly   .  acetaminophen  .  dextrose .  dextrose .  ondansetron  .  ondansetron     Electronically signed by: [name], MD 06/12/2024 1:32 PM      *Some images could not be shown.

## 2024-06-13 NOTE — Progress Notes (Signed)
 [facility] Hospitalist Daily Progress Note Assessment & Plan Impaired functional mobility, balance, gait, and endurance  Impaired mobility and ADLs  Acute cystitis with hematuria Urine culture shows no uropathogen.  Blood culture pending, so far no growth. Patient has finished IV Rocephin  for 3 days - Reviewed renal ultrasound  Gross hematuria  Urine was brown on UA. Has > 20 RBC's . Urine in the canister looked brown. Could be from UTI or due to eliquis  Eliquis was on hold as his hemoglobin was stable Eliquis was resumed on 06/11/2024.  He has not had further episodes of hematuria and hemoglobin is stable.  Continue to monitor closely.  Renal ultrasound reviewed. -   Dyspnea Last night patient required oxygen and was feeling short of breath.  Even today he is short of breath on exertion his BNP is elevated chest x-ray ordered which is pending.  Will give him a dose of IV Lasix.  Continue to monitor respiratory status closely Hyperkalemia Potassium levels are 4.9.  Will place him on low potassium diet.  Recheck levels and monitor closely.  He is off daily potassium supplements. Hypomagnesemia  Dementia (CMD) Continue supportive care Paroxysmal A-fib    (CMD) Continue current regimen of Coreg 12.9 mg p.o. twice daily.  Is likely Eliquis was resumed and is tolerating well CKD (chronic kidney disease), stage III (CMD) Creatinine is baseline. History of stroke Stable continue anticoagulation with Eliquis Hypertension Continue current regimen.  Blood pressure is well-controlled. Type 2 diabetes mellitus HbA1c was 7 in 2023.  Blood sugars are well-controlled  Acute encephalopathy Improved patient is back to baseline.  He has dementia.  Encephalopathy likely secondary to metabolic encephalopathy secondary to UTI.  MRI of the brain did not show any acute abnormality.  Anemia Hemoglobin is stable.  Vitamin B12 and folate levels are normal.  Continue to monitor       DVT prophylaxis:     apixaban Anticipated disposition: To Skilled Nursing Facility Estimated discharge:    1-2 days  CODE status: Patient is DNR Limited Scope of Treatment. This was discussed on the day of admission.   Urinary Catheter Status: is not present.  ___________________________________________________________________  Subjective:   Labs/Studies: US  Renal Bilateral Complete Result Date: 06/10/2024 US  RENAL BILATERAL COMPLETE, 06/10/2024 2:37 PM INDICATION: aki/hematuria COMPARISON: None. TECHNIQUE: Multi-planar, real-time ultrasonography of the retroperitoneum and urinary tract using grayscale imaging was performed, supplemented by color and/or power Doppler as needed. FINDINGS: .  Right Kidney: Length = 9.9 cm. Normal contour and echogenicity. No hydronephrosis or perinephric fluid. No focal mass is identified. .  Left Kidney: Length = 8.2 cm. Normal contour and echogenicity. No hydronephrosis or perinephric fluid. No focal mass is identified. .  Bladder: Normal. .  Additional comments: None.   No hydronephrosis. Unremarkable renal ultrasound.   XR Chest 1 View Result Date: 06/09/2024 XR CHEST 1 VIEW, 06/09/2024 2:14 AM INDICATION:weakness COMPARISON: Chest x-ray and CT chest 05/29/2024 FINDINGS: Supportive devices: None Cardiovascular/lungs/pleura: Similar enlarged cardiomediastinal silhouette. Diffuse interstitial thickening. Persistent patchy bibasilar opacities. The lung volumes. No pleural effusion or pneumothorax. Other: Imaged upper abdomen is unremarkable. Polyarticular degenerative changes without acute osseous abnormality.   1.  Persistent patchy bibasilar opacities which could reflect sequelae of aspiration or infection. 2.  Cardiomegaly with possible pulmonary interstitial edema.   CT Head WO Contrast W Quant CT Tiss Character When Performed Result Date: 06/09/2024 CT HEAD WITHOUT CONTRAST, 06/09/2024 2:16 AM INDICATION: Mental status change, unknown cause COMPARISON: CT head 05/29/2024  and MRI brain  05/02/2022 TECHNIQUE: Axial CT images of the brain from skull base to vertex, including portions of the face and sinuses, were obtained without contrast. Supplemental 2D reformatted images were generated and reviewed as needed. All CT scans at [facility] and [facility] are performed using radiation dose optimization techniques as appropriate to a performed exam, including but not limited to one or more of the following: automatic exposure control, adjustment of the mA and/or kV according to patient size, use of iterative reconstruction technique. In addition, our institution participates in a radiation dose monitoring program to optimize patient radiation exposure. FINDINGS: Calvarium/skull base: No evidence of acute fracture or destructive lesion. Mastoids and middle ears demonstrate no substantial mucosal disease. Bilateral pseudophakia. Paranasal sinuses: No air fluid levels. Trace paranasal sinus mucosal thickening. Brain: No acute large vascular territory infarct. Remote bilateral basal ganglia and right corona radiata lacunar infarcts. No mass effect. No hydrocephalus. No acute hemorrhage. Global parenchymal volume loss with ex vacuo ventricular dilatation. Patchy periventricular and subcortical white matter hypoattenuation, likely sequelae of chronic small vessel disease. Intracranial atherosclerosis.   No acute intracranial abnormality. However, if there is clinical concern for acute ischemia, MRI could further evaluate as CT is relatively insensitive for the detection of an acute infarct in the first 24 to 48 hours.  ECG 12 lead Result Date: 06/09/2024 Ventricular Rate                   90        BPM                 QRS Duration                       120       ms                  Q-T Interval                       394       ms                  QTC Calculation Bazett             481       ms                  Calculated R Axis                  -85        degrees             Calculated T Axis                  -22       degrees             Atrial Fibrillation With Controlled Ventricular Response wPVC s Left Anterior Fascicular Block Right bundle branch block No Significant Change Since Previous Tracing Confirmed by Launie Stanford  [id number]  on 06-09-2024 7 44 00 AM  and CBC:   Lab Results  Component Value Date   WBC 8.08 06/13/2024   RBC 3.09 (L) 06/13/2024   HGB 11.1 (L) 06/13/2024   HCT 33.0 (L) 06/13/2024   PLT 207 06/13/2024   DIFF:  Lab Results  Component Value Date   NEUTOPHILPCT 74 06/09/2024   LYMPHOPCT 16 06/09/2024   MONOPCT 8 06/09/2024   BASOPCT  0 06/09/2024   EOSPCT 2 06/09/2024   NEUTROABS 6.80 06/09/2024   LYMPHSABS 1.50 06/09/2024   MONOSABS 0.70 06/09/2024   BASOSABS 0.00 06/09/2024   EOSABS 0.20 06/09/2024   BMP:  Lab Results  Component Value Date   NA 140 06/13/2024   K 4.9 (H) 06/13/2024   CL 107 06/13/2024   CO2 27 06/13/2024   BUN 23 06/13/2024   GLUCOSE 115 (H) 06/13/2024   CREATININE 1.44 (H) 06/13/2024   CMP:  Lab Results  Component Value Date   NA 140 06/13/2024   K 4.9 (H) 06/13/2024   CL 107 06/13/2024   CO2 27 06/13/2024   BUN 23 06/13/2024   GLUCOSE 115 (H) 06/13/2024   CREATININE 1.44 (H) 06/13/2024   CALCIUM 8.4 (L) 06/13/2024   PROT 7.1 06/09/2024   ALBUMIN 3.4 (L) 06/09/2024   BILITOT 0.8 06/09/2024   ALP 89 06/09/2024   AST 16 06/09/2024   ALT 9 06/09/2024   ANIONGAP 6 06/13/2024     Physical Exam: Temp:  [97.7 F (36.5 C)-98.7 F (37.1 C)] 98.7 F (37.1 C) Heart Rate:  [75-97] 85 Resp:  [17-18] 18 BP: (109-142)/(85-98) 117/90,  Intake/Output Summary (Last 24 hours) at 06/13/2024 1456 Last data filed at 06/13/2024 0900 Gross per 24 hour  Intake 240 ml  Output 350 ml  Net -110 ml   , I/O this shift: In: 240 [P.O.:240] Out: 150 [Urine:150]    GEN: NAD, lying in bed, on 2 L of nasal cannula EYES: EOMI ENT: MMM CV: RRR, no murmurs appreciated PULM: Decreased  breath sounds bilateral basal lung fields with few bibasilar crackles  ABD: soft, NT/ND, +BS EXT: Bilateral lower extremity trace edema NEURO: No focal deficits PSYCH: Alert not oriented

## 2024-06-14 NOTE — Progress Notes (Signed)
 [facility] Hospitalist Daily Progress Note Assessment & Plan Impaired functional mobility, balance, gait, and endurance  Impaired mobility and ADLs  Acute cystitis with hematuria Urine culture shows no uropathogen.  Blood culture pending, so far no growth. Patient has finished IV Rocephin  for 3 days - Reviewed renal ultrasound  Gross hematuria  Urine was brown on UA. Has > 20 RBC's . Urine in the canister looked brown. Could be from UTI or due to eliquis  Eliquis was on hold as his hemoglobin was stable Eliquis was resumed on 06/11/2024.  He has not had further episodes of hematuria and hemoglobin is stable.  Continue to monitor closely.  Renal ultrasound reviewed. -   Dyspnea Patient had fluid overload he received 1 dose of Lasix.  Dyspnea is resolved.  Continue to monitor. Hyperkalemia Potassium levels are 4.5.   He is off daily potassium supplements. Hypomagnesemia  Dementia (CMD) Continue supportive care Paroxysmal A-fib    (CMD) Continue current regimen of Coreg 12.9 mg p.o. twice daily.  Is likely Eliquis was resumed and is tolerating well CKD (chronic kidney disease), stage III (CMD) Creatinine is baseline. History of stroke Stable continue anticoagulation with Eliquis Hypertension Continue current regimen.  Blood pressure is well-controlled. Type 2 diabetes mellitus HbA1c was 7 in 2023.  Blood sugars are well-controlled  Acute encephalopathy Improved patient is back to baseline.  He has dementia.  Encephalopathy likely secondary to metabolic encephalopathy secondary to UTI.  MRI of the brain did not show any acute abnormality.  Anemia Hemoglobin is stable.  Vitamin B12 and folate levels are normal.  Continue to monitor  Dysphagia patient is on modified diet.  He is at very high risk for aspiration    DVT prophylaxis:    apixaban Anticipated disposition assisted living facility with possible hospice Estimated discharge:    1-2 days  CODE status: Patient is DNR  Limited Scope of Treatment. This was discussed on the day of admission.   Urinary Catheter Status: is not present.  I had extensive conversation with patient's daughter at bedside regarding his goals of care.  With his advanced age and dementia and recurrent admissions with pneumonia and UTI patient's daughter does not want him to go to rehab she wants to consider hospice and return to assisted living facility and wants to discuss with hospice team regarding her options I have discussed with case manager to consult hospice.  She wants to continue current treatment for now.  She wants him to be DO NOT RESUSCITATE with limited [place].  ___________________________________________________________________  Subjective: No acute events overnight.  Patient appears comfortable.  On 2 L of nasal cannula.  Denies any shortness of breath.  Patient's daughter is atbedside  Labs/Studies: US  Renal Bilateral Complete Result Date: 06/10/2024 US  RENAL BILATERAL COMPLETE, 06/10/2024 2:37 PM INDICATION: aki/hematuria COMPARISON: None. TECHNIQUE: Multi-planar, real-time ultrasonography of the retroperitoneum and urinary tract using grayscale imaging was performed, supplemented by color and/or power Doppler as needed. FINDINGS: .  Right Kidney: Length = 9.9 cm. Normal contour and echogenicity. No hydronephrosis or perinephric fluid. No focal mass is identified. .  Left Kidney: Length = 8.2 cm. Normal contour and echogenicity. No hydronephrosis or perinephric fluid. No focal mass is identified. .  Bladder: Normal. .  Additional comments: None.   No hydronephrosis. Unremarkable renal ultrasound.   XR Chest 1 View Result Date: 06/09/2024 XR CHEST 1 VIEW, 06/09/2024 2:14 AM INDICATION:weakness COMPARISON: Chest x-ray and CT chest 05/29/2024 FINDINGS: Supportive devices: None Cardiovascular/lungs/pleura: Similar enlarged cardiomediastinal silhouette. Diffuse  interstitial thickening. Persistent patchy bibasilar opacities. The  lung volumes. No pleural effusion or pneumothorax. Other: Imaged upper abdomen is unremarkable. Polyarticular degenerative changes without acute osseous abnormality.   1.  Persistent patchy bibasilar opacities which could reflect sequelae of aspiration or infection. 2.  Cardiomegaly with possible pulmonary interstitial edema.   CT Head WO Contrast W Quant CT Tiss Character When Performed Result Date: 06/09/2024 CT HEAD WITHOUT CONTRAST, 06/09/2024 2:16 AM INDICATION: Mental status change, unknown cause COMPARISON: CT head 05/29/2024 and MRI brain 05/02/2022 TECHNIQUE: Axial CT images of the brain from skull base to vertex, including portions of the face and sinuses, were obtained without contrast. Supplemental 2D reformatted images were generated and reviewed as needed. All CT scans at [facility] and [facility] are performed using radiation dose optimization techniques as appropriate to a performed exam, including but not limited to one or more of the following: automatic exposure control, adjustment of the mA and/or kV according to patient size, use of iterative reconstruction technique. In addition, our institution participates in a radiation dose monitoring program to optimize patient radiation exposure. FINDINGS: Calvarium/skull base: No evidence of acute fracture or destructive lesion. Mastoids and middle ears demonstrate no substantial mucosal disease. Bilateral pseudophakia. Paranasal sinuses: No air fluid levels. Trace paranasal sinus mucosal thickening. Brain: No acute large vascular territory infarct. Remote bilateral basal ganglia and right corona radiata lacunar infarcts. No mass effect. No hydrocephalus. No acute hemorrhage. Global parenchymal volume loss with ex vacuo ventricular dilatation. Patchy periventricular and subcortical white matter hypoattenuation, likely sequelae of chronic small vessel disease. Intracranial atherosclerosis.   No acute  intracranial abnormality. However, if there is clinical concern for acute ischemia, MRI could further evaluate as CT is relatively insensitive for the detection of an acute infarct in the first 24 to 48 hours.  ECG 12 lead Result Date: 06/09/2024 Ventricular Rate                   90        BPM                 QRS Duration                       120       ms                  Q-T Interval                       394       ms                  QTC Calculation Bazett             481       ms                  Calculated R Axis                  -85       degrees             Calculated T Axis                  -22       degrees             Atrial Fibrillation With Controlled Ventricular Response wPVC s Left Anterior Fascicular Block Right bundle  branch block No Significant Change Since Previous Tracing Confirmed by Launie Stanford  [id number]  on 06-09-2024 7 44 00 AM  and CBC:   Lab Results  Component Value Date   WBC 6.75 06/14/2024   RBC 2.90 (L) 06/14/2024   HGB 10.3 (L) 06/14/2024   HCT 30.8 (L) 06/14/2024   PLT 195 06/14/2024   DIFF:  Lab Results  Component Value Date   NEUTOPHILPCT 75 06/14/2024   LYMPHOPCT 13 06/14/2024   MONOPCT 9 06/14/2024   BASOPCT 1 06/14/2024   EOSPCT 3 06/14/2024   NEUTROABS 5.00 06/14/2024   LYMPHSABS 0.90 (L) 06/14/2024   MONOSABS 0.60 06/14/2024   BASOSABS 0.00 06/14/2024   EOSABS 0.20 06/14/2024   BMP:  Lab Results  Component Value Date   NA 142 06/14/2024   K 4.5 06/14/2024   CL 108 (H) 06/14/2024   CO2 29 06/14/2024   BUN 23 06/14/2024   GLUCOSE 108 (H) 06/14/2024   CREATININE 1.50 (H) 06/14/2024   CMP:  Lab Results  Component Value Date   NA 142 06/14/2024   K 4.5 06/14/2024   CL 108 (H) 06/14/2024   CO2 29 06/14/2024   BUN 23 06/14/2024   GLUCOSE 108 (H) 06/14/2024   CREATININE 1.50 (H) 06/14/2024   CALCIUM 8.4 (L) 06/14/2024   PROT 7.1 06/09/2024   ALBUMIN 3.4 (L) 06/09/2024   BILITOT 0.8 06/09/2024   ALP 89 06/09/2024   AST 16 06/09/2024    ALT 9 06/09/2024   ANIONGAP 5 (L) 06/14/2024     Physical Exam: Temp:  [98.1 F (36.7 C)-98.6 F (37 C)] 98.1 F (36.7 C) Heart Rate:  [79-90] 85 Resp:  [15-18] 18 BP: (103-126)/(61-85) 117/85,  Intake/Output Summary (Last 24 hours) at 06/14/2024 1501 Last data filed at 06/14/2024 0416 Gross per 24 hour  Intake 60 ml  Output 800 ml  Net -740 ml   , No intake/output data recorded.   GEN: NAD, lying in bed on 2 L of nasal cannula EYES: EOMI ENT: MMM CV: RRR PULM: CTA B ABD: soft, NT/ND, +BS EXT: No edema CNS alert not oriented

## 2024-06-15 NOTE — Progress Notes (Signed)
 [facility] Hospitalist Daily Progress Note Assessment & Plan Acute cystitis with hematuria Urine culture shows no uropathogen.  Blood culture pending, so far no growth. Patient has finished IV Rocephin  for 3 days - Reviewed renal ultrasound  Gross hematuria  Urine was brown on UA. Has > 20 RBC's . Urine in the canister looked brown. Could be from UTI or due to eliquis  Eliquis was on hold as his hemoglobin was stable Eliquis was resumed on 06/11/2024.  He has not had further episodes of hematuria and hemoglobin is stable.  Continue to monitor closely.  Renal ultrasound reviewed. -   Dyspnea Patient had fluid overload he received 1 dose of Lasix.  Dyspnea is resolved.  Continue to monitor. Hyperkalemia Potassium levels are 4.5.    Hypomagnesemia  Dementia (CMD) Continue supportive care Paroxysmal A-fib    (CMD) Continue current regimen of Coreg 12.9 mg p.o. twice daily.  Is likely Eliquis was resumed and is tolerating well CKD (chronic kidney disease), stage III (CMD) Creatinine is baseline. History of stroke Stable continue anticoagulation with Eliquis Hypertension Continue current regimen.  Blood pressure is well-controlled. Type 2 diabetes mellitus HbA1c was 7 in 2023.  Blood sugars are well-controlled  Acute encephalopathy Improved patient is back to baseline.  He has dementia.  Encephalopathy likely secondary to metabolic encephalopathy secondary to UTI.  MRI of the brain did not show any acute abnormality.  Anemia Hemoglobin is stable.  Vitamin B12 and folate levels are normal.  Continue to monitor  Dysphagia patient is on modified diet.  He is at very high risk for aspiration    DVT prophylaxis:    apixaban Anticipated disposition assisted living facility with possible hospice Estimated discharge:    1-2 days  CODE status: Patient is DNR Limited Scope of Treatment. This was discussed on the day of admission.   Urinary Catheter Status: is not present.  My discussion  with patient's daughter yesterday she wanted to consider hospice and want to talk with hospice team.  Case manager was informed.  I talked with case manager today who is coordinating with assisted living facility hospice and family meetings.  Continue current regimen for now ___________________________________________________________________  Subjective: No acute events overnight.  Patient has remained stable Labs/Studies: US  Renal Bilateral Complete Result Date: 06/10/2024 US  RENAL BILATERAL COMPLETE, 06/10/2024 2:37 PM INDICATION: aki/hematuria COMPARISON: None. TECHNIQUE: Multi-planar, real-time ultrasonography of the retroperitoneum and urinary tract using grayscale imaging was performed, supplemented by color and/or power Doppler as needed. FINDINGS: .  Right Kidney: Length = 9.9 cm. Normal contour and echogenicity. No hydronephrosis or perinephric fluid. No focal mass is identified. .  Left Kidney: Length = 8.2 cm. Normal contour and echogenicity. No hydronephrosis or perinephric fluid. No focal mass is identified. .  Bladder: Normal. .  Additional comments: None.   No hydronephrosis. Unremarkable renal ultrasound.   XR Chest 1 View Result Date: 06/09/2024 XR CHEST 1 VIEW, 06/09/2024 2:14 AM INDICATION:weakness COMPARISON: Chest x-ray and CT chest 05/29/2024 FINDINGS: Supportive devices: None Cardiovascular/lungs/pleura: Similar enlarged cardiomediastinal silhouette. Diffuse interstitial thickening. Persistent patchy bibasilar opacities. The lung volumes. No pleural effusion or pneumothorax. Other: Imaged upper abdomen is unremarkable. Polyarticular degenerative changes without acute osseous abnormality.   1.  Persistent patchy bibasilar opacities which could reflect sequelae of aspiration or infection. 2.  Cardiomegaly with possible pulmonary interstitial edema.   CT Head WO Contrast W Quant CT Tiss Character When Performed Result Date: 06/09/2024 CT HEAD WITHOUT CONTRAST, 06/09/2024 2:16  AM INDICATION: Mental status change,  unknown cause COMPARISON: CT head 05/29/2024 and MRI brain 05/02/2022 TECHNIQUE: Axial CT images of the brain from skull base to vertex, including portions of the face and sinuses, were obtained without contrast. Supplemental 2D reformatted images were generated and reviewed as needed. All CT scans at [facility] and [facility] are performed using radiation dose optimization techniques as appropriate to a performed exam, including but not limited to one or more of the following: automatic exposure control, adjustment of the mA and/or kV according to patient size, use of iterative reconstruction technique. In addition, our institution participates in a radiation dose monitoring program to optimize patient radiation exposure. FINDINGS: Calvarium/skull base: No evidence of acute fracture or destructive lesion. Mastoids and middle ears demonstrate no substantial mucosal disease. Bilateral pseudophakia. Paranasal sinuses: No air fluid levels. Trace paranasal sinus mucosal thickening. Brain: No acute large vascular territory infarct. Remote bilateral basal ganglia and right corona radiata lacunar infarcts. No mass effect. No hydrocephalus. No acute hemorrhage. Global parenchymal volume loss with ex vacuo ventricular dilatation. Patchy periventricular and subcortical white matter hypoattenuation, likely sequelae of chronic small vessel disease. Intracranial atherosclerosis.   No acute intracranial abnormality. However, if there is clinical concern for acute ischemia, MRI could further evaluate as CT is relatively insensitive for the detection of an acute infarct in the first 24 to 48 hours.  ECG 12 lead Result Date: 06/09/2024 Ventricular Rate                   90        BPM                 QRS Duration                       120       ms                  Q-T Interval                       394       ms                  QTC Calculation Bazett              481       ms                  Calculated R Axis                  -85       degrees             Calculated T Axis                  -22       degrees             Atrial Fibrillation With Controlled Ventricular Response wPVC s Left Anterior Fascicular Block Right bundle branch block No Significant Change Since Previous Tracing Confirmed by Launie Stanford  [id number]  on 06-09-2024 7 44 00 AM  and CBC:   Lab Results  Component Value Date   WBC 6.75 06/14/2024   RBC 2.90 (L) 06/14/2024   HGB 10.3 (L) 06/14/2024   HCT 30.8 (L) 06/14/2024   PLT 195 06/14/2024   DIFF:  Lab Results  Component Value Date   NEUTOPHILPCT 75 06/14/2024   LYMPHOPCT 13  06/14/2024   MONOPCT 9 06/14/2024   BASOPCT 1 06/14/2024   EOSPCT 3 06/14/2024   NEUTROABS 5.00 06/14/2024   LYMPHSABS 0.90 (L) 06/14/2024   MONOSABS 0.60 06/14/2024   BASOSABS 0.00 06/14/2024   EOSABS 0.20 06/14/2024   BMP:  Lab Results  Component Value Date   NA 142 06/14/2024   K 4.5 06/14/2024   CL 108 (H) 06/14/2024   CO2 29 06/14/2024   BUN 23 06/14/2024   GLUCOSE 108 (H) 06/14/2024   CREATININE 1.50 (H) 06/14/2024   CMP:  Lab Results  Component Value Date   NA 142 06/14/2024   K 4.5 06/14/2024   CL 108 (H) 06/14/2024   CO2 29 06/14/2024   BUN 23 06/14/2024   GLUCOSE 108 (H) 06/14/2024   CREATININE 1.50 (H) 06/14/2024   CALCIUM 8.4 (L) 06/14/2024   PROT 7.1 06/09/2024   ALBUMIN 3.4 (L) 06/09/2024   BILITOT 0.8 06/09/2024   ALP 89 06/09/2024   AST 16 06/09/2024   ALT 9 06/09/2024   ANIONGAP 5 (L) 06/14/2024     Physical Exam: Temp:  [97.6 F (36.4 C)-99 F (37.2 C)] 98.1 F (36.7 C) Heart Rate:  [51-91] 67 Resp:  [18-19] 18 BP: (122-139)/(71-93) 123/78,  Intake/Output Summary (Last 24 hours) at 06/15/2024 1624 Last data filed at 06/15/2024 1100 Gross per 24 hour  Intake 180 ml  Output 250 ml  Net -70 ml   , I/O this shift: In: 120 [P.O.:120] Out: 100 [Urine:100]  GEN: NAD, lying in bed,oxygen canula   EYES: EOMI ENT: MMM CV: RRR PULM: CTA B ABD: soft, NT/ND, +BS EXT: No edema

## 2024-06-29 DEATH — deceased
